# Patient Record
Sex: Female | Born: 1980 | Race: White | Hispanic: No | Marital: Married | Smoking: Former smoker
Health system: Southern US, Community
[De-identification: ages and names within clinical notes are randomized; demographics above are authoritative.]

## PROBLEM LIST (undated history)

## (undated) DIAGNOSIS — F419 Anxiety disorder, unspecified: Secondary | ICD-10-CM

## (undated) DIAGNOSIS — N200 Calculus of kidney: Secondary | ICD-10-CM

## (undated) DIAGNOSIS — Z9889 Other specified postprocedural states: Secondary | ICD-10-CM

## (undated) DIAGNOSIS — F32A Depression, unspecified: Secondary | ICD-10-CM

## (undated) DIAGNOSIS — F329 Major depressive disorder, single episode, unspecified: Secondary | ICD-10-CM

## (undated) DIAGNOSIS — Z8719 Personal history of other diseases of the digestive system: Secondary | ICD-10-CM

## (undated) DIAGNOSIS — Z87442 Personal history of urinary calculi: Secondary | ICD-10-CM

## (undated) DIAGNOSIS — R112 Nausea with vomiting, unspecified: Secondary | ICD-10-CM

## (undated) DIAGNOSIS — K219 Gastro-esophageal reflux disease without esophagitis: Secondary | ICD-10-CM

## (undated) HISTORY — PX: CHOLECYSTECTOMY: SHX55

## (undated) HISTORY — DX: Major depressive disorder, single episode, unspecified: F32.9

## (undated) HISTORY — PX: EYE SURGERY: SHX253

## (undated) HISTORY — DX: Anxiety disorder, unspecified: F41.9

## (undated) HISTORY — DX: Depression, unspecified: F32.A

## (undated) HISTORY — PX: BREAST SURGERY: SHX581

## (undated) HISTORY — DX: Calculus of kidney: N20.0

---

## 2001-02-08 ENCOUNTER — Encounter (HOSPITAL_BASED_OUTPATIENT_CLINIC_OR_DEPARTMENT_OTHER): Payer: Self-pay | Admitting: General Surgery

## 2001-02-10 ENCOUNTER — Encounter (INDEPENDENT_AMBULATORY_CARE_PROVIDER_SITE_OTHER): Payer: Self-pay | Admitting: Specialist

## 2001-02-10 ENCOUNTER — Observation Stay (HOSPITAL_COMMUNITY): Admission: RE | Admit: 2001-02-10 | Discharge: 2001-02-11 | Payer: Self-pay | Admitting: General Surgery

## 2001-02-10 ENCOUNTER — Encounter (HOSPITAL_BASED_OUTPATIENT_CLINIC_OR_DEPARTMENT_OTHER): Payer: Self-pay | Admitting: General Surgery

## 2001-03-19 ENCOUNTER — Other Ambulatory Visit: Admission: RE | Admit: 2001-03-19 | Discharge: 2001-03-19 | Payer: Self-pay | Admitting: Family Medicine

## 2002-03-21 ENCOUNTER — Other Ambulatory Visit: Admission: RE | Admit: 2002-03-21 | Discharge: 2002-03-21 | Payer: Self-pay | Admitting: Family Medicine

## 2002-09-05 ENCOUNTER — Ambulatory Visit (HOSPITAL_COMMUNITY): Admission: RE | Admit: 2002-09-05 | Discharge: 2002-09-05 | Payer: Self-pay | Admitting: Gastroenterology

## 2003-11-17 ENCOUNTER — Other Ambulatory Visit: Admission: RE | Admit: 2003-11-17 | Discharge: 2003-11-17 | Payer: Self-pay | Admitting: *Deleted

## 2004-07-30 ENCOUNTER — Emergency Department (HOSPITAL_COMMUNITY): Admission: EM | Admit: 2004-07-30 | Discharge: 2004-07-30 | Payer: Self-pay | Admitting: Emergency Medicine

## 2004-12-11 ENCOUNTER — Other Ambulatory Visit: Admission: RE | Admit: 2004-12-11 | Discharge: 2004-12-11 | Payer: Self-pay | Admitting: Family Medicine

## 2005-05-09 ENCOUNTER — Ambulatory Visit: Payer: Self-pay | Admitting: Internal Medicine

## 2005-05-20 ENCOUNTER — Ambulatory Visit: Payer: Self-pay | Admitting: Internal Medicine

## 2005-06-06 ENCOUNTER — Ambulatory Visit: Payer: Self-pay | Admitting: Gastroenterology

## 2005-07-01 ENCOUNTER — Ambulatory Visit: Payer: Self-pay | Admitting: Gastroenterology

## 2005-08-27 ENCOUNTER — Ambulatory Visit: Payer: Self-pay | Admitting: Gastroenterology

## 2005-09-24 ENCOUNTER — Ambulatory Visit: Payer: Self-pay | Admitting: Gastroenterology

## 2005-11-26 ENCOUNTER — Ambulatory Visit: Payer: Self-pay | Admitting: Gastroenterology

## 2005-12-17 ENCOUNTER — Ambulatory Visit: Payer: Self-pay | Admitting: Gastroenterology

## 2007-03-11 ENCOUNTER — Emergency Department (HOSPITAL_COMMUNITY): Admission: EM | Admit: 2007-03-11 | Discharge: 2007-03-11 | Payer: Self-pay | Admitting: Emergency Medicine

## 2007-07-22 ENCOUNTER — Inpatient Hospital Stay (HOSPITAL_COMMUNITY): Admission: AD | Admit: 2007-07-22 | Discharge: 2007-07-22 | Payer: Self-pay | Admitting: Obstetrics and Gynecology

## 2007-07-29 ENCOUNTER — Inpatient Hospital Stay (HOSPITAL_COMMUNITY): Admission: AD | Admit: 2007-07-29 | Discharge: 2007-07-29 | Payer: Self-pay | Admitting: Obstetrics and Gynecology

## 2007-08-14 ENCOUNTER — Inpatient Hospital Stay (HOSPITAL_COMMUNITY): Admission: AD | Admit: 2007-08-14 | Discharge: 2007-08-17 | Payer: Self-pay | Admitting: Obstetrics and Gynecology

## 2009-06-13 LAB — TSH: TSH: 3.66 u[IU]/mL (ref <=5.90)

## 2009-12-11 ENCOUNTER — Ambulatory Visit (HOSPITAL_COMMUNITY): Admission: RE | Admit: 2009-12-11 | Discharge: 2009-12-11 | Payer: Self-pay | Admitting: Obstetrics and Gynecology

## 2010-04-24 ENCOUNTER — Encounter (HOSPITAL_COMMUNITY)
Admission: RE | Admit: 2010-04-24 | Discharge: 2010-04-24 | Disposition: A | Discharge status: Home / Self Care | Payer: Managed Care, Other (non HMO) | Source: Ambulatory Visit | Attending: Obstetrics and Gynecology | Admitting: Obstetrics and Gynecology

## 2010-04-24 DIAGNOSIS — Z01812 Encounter for preprocedural laboratory examination: Secondary | ICD-10-CM | POA: Insufficient documentation

## 2010-04-24 LAB — CBC
HCT: 35.4 % — ABNORMAL LOW (ref 36.0–46.0)
Hemoglobin: 11.3 g/dL — ABNORMAL LOW (ref 12.0–15.0)
MCH: 26.5 pg (ref 26.0–34.0)
MCHC: 31.9 g/dL (ref 30.0–36.0)
MCV: 82.9 fL (ref 78.0–100.0)
Platelets: 228 10*3/uL (ref 150–400)
RBC: 4.27 MIL/uL (ref 3.87–5.11)
RDW: 15.8 % — ABNORMAL HIGH (ref 11.5–15.5)
WBC: 11.6 10*3/uL — ABNORMAL HIGH (ref 4.0–10.5)

## 2010-04-24 LAB — RPR: RPR Ser Ql: NONREACTIVE

## 2010-04-24 LAB — SURGICAL PCR SCREEN
MRSA, PCR: NEGATIVE
Staphylococcus aureus: NEGATIVE

## 2010-05-01 ENCOUNTER — Inpatient Hospital Stay (HOSPITAL_COMMUNITY)
Admission: RE | Admit: 2010-05-01 | Discharge: 2010-05-04 | DRG: 766 | Disposition: A | Discharge status: Home / Self Care | Payer: Managed Care, Other (non HMO) | Source: Ambulatory Visit | Attending: Obstetrics and Gynecology | Admitting: Obstetrics and Gynecology

## 2010-05-01 DIAGNOSIS — E669 Obesity, unspecified: Secondary | ICD-10-CM | POA: Diagnosis present

## 2010-05-01 DIAGNOSIS — O34219 Maternal care for unspecified type scar from previous cesarean delivery: Principal | ICD-10-CM | POA: Diagnosis present

## 2010-05-02 LAB — CBC
HCT: 29.6 % — ABNORMAL LOW (ref 36.0–46.0)
Hemoglobin: 9.5 g/dL — ABNORMAL LOW (ref 12.0–15.0)
MCH: 26.5 pg (ref 26.0–34.0)
MCHC: 32.1 g/dL (ref 30.0–36.0)
MCV: 82.7 fL (ref 78.0–100.0)
Platelets: 170 10*3/uL (ref 150–400)
RBC: 3.58 MIL/uL — ABNORMAL LOW (ref 3.87–5.11)
RDW: 15.8 % — ABNORMAL HIGH (ref 11.5–15.5)
WBC: 15 10*3/uL — ABNORMAL HIGH (ref 4.0–10.5)

## 2010-05-04 NOTE — Discharge Summary (Signed)
  NAMEELIANI, LECLERE            ACCOUNT NO.:  [id number]  MEDICAL RECORD NO.:  [id number]           PATIENT TYPE:  I  LOCATION:  9131                          [facility]ACILITY:  [facility]  PHYSICIAN:  Gerrit Friends. Aldona Bar, M.D.   DATE OF BIRTH:  [birth year]  DATE OF ADMISSION:  05/01/2010 DATE OF DISCHARGE:  05/04/2010                              DISCHARGE SUMMARY   DISCHARGE DIAGNOSES: 1. Term pregnancy, delivered 8 pounds 13 ounces female infant, Apgars 9     and 9. 2. Previous cesarean section. 3. Obesity.  PROCEDURE:  Repeat low transverse cesarean section.  SUMMARY:  This 30 year old patient was admitted for repeat cesarean section at term having had a relatively benign pregnancy.  She was taken to the operating room on May 01, 2010, and was delivered of an 8 pounds and 13 ounces female infant with good Apgars.  Her postoperative course was relatively uncomplicated.  She was restarted on her Prozac postpartum.  Discharge hemoglobin was 9.3 with a white count of 15,000 and a platelet count of 170,000.  On the morning of May 04, 2010, she was afebrile, vital signs were stable.  She was tolerating a regular diet well.  She was bottle feeding without difficulty, and she was desirous of discharge.  She will return to the office for followup.     Gerrit Friends. Aldona Bar, M.D.     RMW/MEDQ  D:  05/04/2010  T:  05/04/2010  Job:  [id number]  Electronically Signed by Annamaria Helling M.D. on 05/04/2010 03:39:51 PM

## 2010-05-04 NOTE — Discharge Summary (Signed)
  NAMEESPERANSA, [name]            ACCOUNT NO.:  [id number]  MEDICAL RECORD NO.:  [id number]           PATIENT TYPE:  I  LOCATION:  9131                          [facility]  PHYSICIAN:  Gerrit Friends. Aldona Bar, M.D.   DATE OF BIRTH:  Dec 10, 1980  DATE OF ADMISSION:  05/01/2010 DATE OF DISCHARGE:  05/04/2010                              DISCHARGE SUMMARY   DISCHARGE DIAGNOSES: 1. Term pregnancy, delivered 8 pounds 13 ounces female infant, Apgars 9     and 9. 2. Blood type A+. 3. Previous cesarean section. 4. Obesity.  PROCEDURE:  Repeat low transverse cesarean section.  SUMMARY:  This 30 year old gravida 2 now para 2 was admitted on May 01, 2010, at term for repeat cesarean section.  She was taken to the operating room by Dr. Henderson Cloud and was delivered of an 8 pounds 13 ounces female infant with good Apgars.  Her postoperative course was uncomplicated.  On the morning of May 04, 2010, she was ambulating well, tolerating a regular diet well, having normal bowel and bladder function, was afebrile, was bottle feeding without difficulty and was desirous of discharge.  Her wound was clean and dry.  She will return to the office for staple removal on May 06, 2010, per Dr. Kittie Plater instructions.  She was given a discharge brochure at the time of discharge and understood all instructions well.  Discharge hemoglobin was 9.5 with a white count 15,000 and a platelet count of 170,000.  DISCHARGE MEDICATIONS:  Prescription for Vicodin 7.5 mg to use one every 4-6 hours as needed for severe pain, Motrin 600 mg every 6 hours as needed for cramping or mild pain, and Prozac 20 mg - one up to twice daily as directed.  (She was on Prozac prior to pregnancy for anxiety and feels that that is returning).  She was told her hemoglobin was 9.5 but says that iron makes her very constipated, so she was encouraged to have her diet provide her iron by eating lots of green vegetables,  etc.  CONDITION ON DISCHARGE:  Improved.     Gerrit Friends. Aldona Bar, M.D.     RMW/MEDQ  D:  05/04/2010  T:  05/04/2010  Job:  [id number]  Electronically Signed by Annamaria Helling M.D. on 05/04/2010 03:39:55 PM

## 2010-05-13 NOTE — Op Note (Signed)
[name], [name]            ACCOUNT NO.:  [id number]  MEDICAL RECORD NO.:  [id number]           PATIENT TYPE:  I  LOCATION:  9131                          [facility]ILITY[facility]  PHYSICIAN:  Carrington Clamp, M.D. DATE OF BIRTH:  February 16, 1981  DATE OF PROCEDURE:  05/01/2010 DATE OF DISCHARGE:                              OPERATIVE REPORT   PREOPERATIVE DIAGNOSIS:  Desired repeat cesarean section at term.  POSTOPERATIVE DIAGNOSIS:  Desired repeat cesarean section at term.  PROCEDURE:  Low-transverse cesarean section.  SURGEON:  Carrington Clamp, MD  ASSISTANT:  [name], PA-C  ANESTHESIA:  Spinal.  FINDINGS:  Boy infant, breech presentation, Apgars 9 and 9, and 8 pounds 13 ounces.  Normal tubes, ovaries, and uterus were seen.  SPECIMENS:  None.  ESTIMATED BLOOD LOSS:  500 mL.  IV FLUIDS:  2400 mL.  URINE OUTPUT:  150 mL.  COMPLICATIONS:  None surgical, however, anesthetically the patient had 2 episodes of asystole witnessed by the CRNAs only.  The patient had actually passed out but was soon responsive and all parameters of vital signs were stable after that.  The patient was slightly somnolent after the episode.  There were no other further complications.  The patient tolerated the procedure well.  The nurse and anesthesiologist were on hand at the time and there were no complications from what appeared to be a vagal reaction to peritoneal manipulation.  COUNTS:  Correct x3.  TECHNIQUE:  After adequate spinal anesthesia was achieved, the patient was prepped and draped in the usual sterile fashion, dorsal supine position with leftward tilt.  A Pfannenstiel skin incision was made with a scalpel and carried down to the fascia with Bovie cautery.  The fascia was incised in midline with the scalpel and carried in transverse curvilinear manner with Mayo scissors.  The fascia was reflected superiorly and inferiorly from the rectus muscles.  The rectus muscles were  split in the midline.  A bowel free portion of the peritoneum was then carefully entered into with a scalpel for the superficial layers and then the hemostat for the deeper layers.  The peritoneum was then incised in a superior-inferior manner with the Mayo and Metzenbaum scissors with good visualization of the bowel and the bladder. Peritoneum was then stretched open and the [employer] placed.  The vesicouterine fascia was then tented up and incised in a transverse curvilinear manner.  The bladder flap was retracted inferiorly and a 2- cm incision was made in the upper portion of the lower uterine segment until clear fluid was noted on entry into the amnion.  The uterine incision was then extended in a transverse curvilinear manner bluntly and the baby was identified in a breech presentation and delivered without complication.  The baby was bulb suctioned.  The cord was clamped, cut, and the baby handed to waiting pediatrics.  The placenta was then delivered manually in the uterus and was cleared of all debris. The uterine incision was closed with a running lock stitch of 0- Monocryl.  An imbricating layer of 0-Monocryl was performed as well. Once the hemostasis was achieved in the uterine incision with some Bovie  cautery and the edges of the serosa, the abdomen was irrigated and the ovaries and tubes checked.  The uterine incision remained hemostatic and all instruments were withdrawn from the peritoneal cavity.  The peritoneum was then closed with a running stitch of 2-0 Vicryl.  This incorporated the rectus muscles as a separate layer.  The fascia was then closed with a running stitch of 0-Vicryl.  The subcutaneous tissue was rendered hemostatic and revised with the Bovie cautery.  Once hemostasis was achieved, this was closed with 5 interrupted stitches of 2-0 plain gut.  The skin was closed with staples.  The patient tolerated the procedure well and was returned to recovery  in stable condition.     Carrington Clamp, M.D.     MH/MEDQ  D:  05/01/2010  T:  05/01/2010  Job:  [id number]  Electronically Signed by Carrington Clamp MD on 05/13/2010 08:28:43 AM

## 2010-07-23 NOTE — Op Note (Signed)
[name], [name]            ACCOUNT NO.:  [id number]   MEDICAL RECORD NO.:  [id number]          PATIENT TYPE:  INP   LOCATION:  9124                          [facility]ILITY:  [facility]   PHYSICIAN:  Carrington Clamp, M.D. DATE OF BIRTH:  01/08/1981   DATE OF PROCEDURE:  08/14/2007  DATE OF DISCHARGE:                               OPERATIVE REPORT   PREOPERATIVE DIAGNOSES:  1. Arrest of active phase.  2. Nonreassuring fetal heart tones remote vaginal delivery.   POSTOPERATIVE DIAGNOSIS:  1. Arrest of active phase.  2. Nonreassuring fetal heart tones remote vaginal delivery.   PROCEDURE:  Low-transverse cesarean section.   SURGEON:  Carrington Clamp, MD.   ANESTHESIA:  Epidural.   ESTIMATED BLOOD LOSS:  700 mL.   IV FLUIDS:  1200 mL.   URINE OUTPUT:  100 mL.   MEDICATIONS:  Ancef and Pitocin.   PATHOLOGY:  None.   COMPLICATIONS:  None.   FINDINGS:  Female infant, vertex presentation, transverse OT with Apgars  of 7 and 9.  Weight was 8 pounds and 4 ounces.  Normal tubes, ovaries,  and uterus were otherwise seen.   COUNTS:  Correct x3.   TECHNIQUE:  After adequate epidural anesthesia was achieved, the patient  was prepped and draped in the usual sterile fashion in dorsal supine  position with leftward tilt.  A Pfannenstiel skin incision was made with  scalpel and carried down to the fascia with the Bovie cautery.  The  fascia was incised in the midline with a scalpel and carried in the  transverse curvilinear manner with Mayo scissors.  The fascia was  reflected superior to inferiorly from the rectus muscles.  Rectus muscle  split in rhe midline.  The bowel free portion of peritoneum was entered  into bluntly and peritoneum incised in the superior inferior manner with  good visualization of the bowel and the bladder.   The bladder blade was placed in the vesicouterine fascia and tented up  and incised in transverse curvilinear manner.  The bladder flap was  created with  blunt dissection and bladder blade replaced.  A 2-cm  incision was made in the upper portion of lower uterine segment  transversely.  Meconium was noted on entry into the amnion which had not  been noted earlier.  The incision was extended transversely, bluntly and  the baby was identified in the occiput transverse presentation.  The  baby was delivered without complications through the incision and was  bulb suctioned.  The cord was clamped and cut, and the baby was handed  to awaiting pediatrics.   The placenta was delivered manually and the uterus exteriorized, wrapped  in wet lap, cleared of all debris.  The uterine incision was then closed  with a running lock stitch of 0 Monocryl.  An imbricating layer of 0  Monocryl was performed as well.  The uterus was reapproximated in the  abdomen.  The abdomen cleared of all debris with irrigation.  The  uterine incisions were reinspected and found to be hemostatic.  The  peritoneum was then closed with a running stitch of 2-0 Vicryl and  this  incorporated the rectus muscles as a separate layer.  The fascia was  closed with running stitch of 0 Vicryl.  The subcutaneous tissue was  then hemostatic with Bovie cautery and irrigation.  This was then closed  with interrupted stitches of 2-0 plain gut.  The skin was closed with  staples.  The patient tolerated the procedure well and was returned to  the recovery in stable condition.      Carrington Clamp, M.D.  Electronically Signed     MH/MEDQ  D:  08/14/2007  T:  08/15/2007  Job:  [id number]

## 2010-07-26 NOTE — Op Note (Signed)
[facility]  Patient:    [name], [name] Visit Number: [id number] MRN: [id number]          Service Type: SUR Location: 3W 0376 01 Attending Physician:  Fortino Sic Dictated by:   Marnee Spring. Wiliam Ke, M.D. Proc. Date: 02/10/01 Admit Date:  02/10/2001                             Operative Report  PREOPERATIVE DIAGNOSIS:  Symptomatic cholelithiasis.  POSTOPERATIVE DIAGNOSIS:  Symptomatic cholelithiasis.  OPERATION:  Laparoscopic cholecystectomy with intraoperative cholangiogram.  SURGEON: Marnee Spring. Wiliam Ke, M.D.  ASSISTANT:  Mardene Celeste. Lurene Shadow, M.D.  ANESTHESIA:  General endotracheal by hospital.  DESCRIPTION OF PROCEDURE:  Under good endotracheal anesthesia, the skin of the abdomen was prepped and draped in the usual manner. The incision was made above the umbilicus, the abdomen was entered, and a pursestring suture was placed. Hasson cannula was inserted and pneumoperitoneum was obtained. The peritoneal surfaces appeared normal. A 10 mm with two 5 mm cannulas were placed in the usual fashion. Dissecting the porta hepatis, the cystic duct-gallbladder junction was identified. A clip was placed on the gallbladder side. The cystic duct was opened, Reddick catheter was inserted, and a cholangiogram was performed using a fluoroscopic technique. This was normal. The cystic duct was triply clipped and cut leaving two clips proximally. The cystic artery was triply clipped then cut leaving two clips proximally. The gallbladder was removed from the liver bed with electrocautery current and hemostasis was obtained with electrocautery current. The gallbladder was removed via the umbilical port and an endopouch. The right upper quadrant was copiously irrigated with saline and suctioned dry. Cannula was removed, pursestring suture was tied, the skin wounds were closed with 4-0 Vicryl and Steri-Strips. Estimated blood loss for the procedure was minimal. The  patient received no blood and left the operating room in stable condition after sponge and needle counts were verified. Dictated by:   Marnee Spring. Wiliam Ke, M.D. Attending Physician:  Fortino Sic DD:  02/10/01 TD:  02/10/01 Job: [id number]

## 2010-07-26 NOTE — Op Note (Signed)
   NAMEKAIANNA, DOLEZAL                   ACCOUNT NO.:  [id number]   MEDICAL RECORD NO.:  [id number]                   PATIENT TYPE:  AMB   LOCATION:  ENDO                                 FACILITY:  [facility]   PHYSICIAN:  Anselmo Rod, M.D.               DATE OF BIRTH:  [birth year]   DATE OF PROCEDURE:  09/05/2002  DATE OF DISCHARGE:                                 OPERATIVE REPORT   PROCEDURE PERFORMED:  Colonoscopy.   ENDOSCOPIST:  Anselmo Rod, M.D.   INSTRUMENT USED:  Olympus video colonoscope.   INDICATION FOR PROCEDURE:  Abdominal pain with mucoid stools and diarrhea in  a 30 year old white female with a family history of Crohn's.  Rule out IBD.   PREPROCEDURE PREPARATION:  Informed consent was procured from the patient.  The patient was fasted for eight hours prior to the procedure and prepped  with a bottle of magnesium citrate and two bottles of Fleet's Phospho-Soda  as she could not tolerate the GoLYTELY prep.   PREPROCEDURE PHYSICAL:  VITAL SIGNS:  The patient had stable vital signs.  NECK:  Supple.  CHEST:  Clear to auscultation.  S1, S2 regular.  ABDOMEN:  Soft with normal bowel sounds.   DESCRIPTION OF PROCEDURE:  The patient was placed in the left lateral  decubitus position and sedated with 75 mg of Demerol and 7 mg of Versed  intravenously.  Once the patient was adequately sedate and maintained on low-  flow oxygen and continuous cardiac monitoring, the Olympus video colonoscope  was advanced from the rectum to the cecum and terminal ileum without  difficulty.  The entire colonic mucosa, including the terminal ileum,  appeared normal and without lesions.  No masses, polyps, erosions,  ulcerations, or diverticula were seen.  The appendiceal orifice and the  ileocecal valve were clearly visualized and photographed.  Retroflexion in  the rectum revealed no abnormalities.   IMPRESSION:  Normal colonoscopy to the terminal ileum.  No masses, polyps,  erosions, ulcerations, or diverticula seen.    RECOMMENDATIONS:  1. A high-fiber diet with liberal fluid intake has been recommended for     possible IBS-like symptoms.  2. Outpatient follow-up in the next two weeks for further recommendations.                                               Anselmo Rod, M.D.    JNM/MEDQ  D:  09/05/2002  T:  09/06/2002  Job:  [id number]   cc:   Talmadge Coventry, M.D.  [address]  [place]  [place] [address]  Fax: [phone]

## 2010-07-26 NOTE — Discharge Summary (Signed)
[name], [name]            ACCOUNT NO.:  [id number]   MEDICAL RECORD NO.:  [id number]          PATIENT TYPE:  INP   LOCATION:  9124                          [facility]   PHYSICIAN:  Ilda Mori, M.D.   DATE OF BIRTH:  1980-03-16   DATE OF ADMISSION:  08/14/2007  DATE OF DISCHARGE:  08/17/2007                               DISCHARGE SUMMARY   FINAL DIAGNOSES:  1. Intrauterine pregnancy at 82 weeks' gestation for induction of      labor.  2. Arrest of the active phase of labor and nonreassuring fetal heart      tones.  3. Remote from vaginal delivery.   PROCEDURE:  Primary low transverse cesarean section.   SURGEON:  Carrington Clamp, MD.   COMPLICATIONS:  None.   This 30 year old G1 P0 was admitted on admitted on June 6 to undergo an  induction of labor.  The patient's antepartum course up to this point  has been uncomplicated.  She did have a negative group B strep culture  obtained in our office.  The patient dilated about 5 cm of dilation and  then had no change over her cervix over many hours.  There was also some  severe variables noted with contractions.  At this point secondary to  the patient being remote from delivery and some nonreassuring fetal  heart tones, the decision was made to proceed with a cesarean section.  The patient was taken to the operating room on August 14, 2007, by Dr.  Carrington Clamp where a primary low transverse cesarean section was  performed with the delivery of a 8 pounds 4 ounces female infant with  Apgars of 7 and 9.  The infant presented in the transverse OT  presentation.  Delivery went without complications.  The patient's  postoperative course was benign without any significant fevers.  The  patient was felt ready for discharge on postoperative day #3.  She was  sent home on a regular diet, told to decrease activities, told to  continue her prenatal vitamins, was given Darvocet N 100 #20 one every 4-  6 hours as needed for  her pain, told she could use ibuprofen up to 600  mg every 6 hours as needed for pain, and was to follow up in our office  in 4 weeks.  Instructions and precautions were reviewed with the  patient.   LABORATORIES ON DISCHARGE:  The patient had a hemoglobin of 12.2, white  blood cell count of 22.2, and platelets of 221,000.      [name], P.A.-C.      Ilda Mori, M.D.  Electronically Signed   MB/MEDQ  D:  09/03/2007  T:  09/04/2007  Job:  [id number]

## 2010-08-13 ENCOUNTER — Other Ambulatory Visit (INDEPENDENT_AMBULATORY_CARE_PROVIDER_SITE_OTHER): Payer: Self-pay | Admitting: Surgery

## 2010-08-14 LAB — VITAMIN D 25 HYDROXY (VIT D DEFICIENCY, FRACTURES): Vit D, 25-Hydroxy: 32 ng/mL (ref 30–89)

## 2010-08-19 ENCOUNTER — Other Ambulatory Visit (INDEPENDENT_AMBULATORY_CARE_PROVIDER_SITE_OTHER): Payer: Self-pay | Admitting: Surgery

## 2010-08-20 LAB — CALCIUM, URINE, 24 HOUR
Calcium, 24 hour urine: 54 mg/d — ABNORMAL LOW (ref 100–250)
Calcium, Ur: 6 mg/dL

## 2010-08-27 ENCOUNTER — Ambulatory Visit (HOSPITAL_COMMUNITY): Payer: Managed Care, Other (non HMO)

## 2010-08-27 ENCOUNTER — Encounter (HOSPITAL_COMMUNITY): Payer: Self-pay

## 2010-08-27 ENCOUNTER — Encounter (HOSPITAL_COMMUNITY)
Admission: RE | Admit: 2010-08-27 | Discharge: 2010-08-27 | Disposition: A | Discharge status: Home / Self Care | Payer: Managed Care, Other (non HMO) | Source: Ambulatory Visit | Attending: Surgery | Admitting: Surgery

## 2010-08-27 DIAGNOSIS — E0590 Thyrotoxicosis, unspecified without thyrotoxic crisis or storm: Secondary | ICD-10-CM | POA: Insufficient documentation

## 2010-08-27 MED ORDER — TECHNETIUM TC 99M SESTAMIBI - CARDIOLITE
23.7000 | Freq: Once | INTRAVENOUS | Status: AC | PRN
  Administered 2010-08-27: 10:00:00 23.7 via INTRAVENOUS

## 2010-09-04 ENCOUNTER — Telehealth (INDEPENDENT_AMBULATORY_CARE_PROVIDER_SITE_OTHER): Payer: Self-pay

## 2010-09-04 NOTE — Telephone Encounter (Signed)
Message copied by Glenna Fellows on Wed Sep 04, 2010 10:38 AM ------      Message from: [facility]      Created: Tue Sep 03, 2010 10:13 AM      Contact: [phone]       [name] called regarding information about her test results.

## 2010-10-07 ENCOUNTER — Encounter (INDEPENDENT_AMBULATORY_CARE_PROVIDER_SITE_OTHER): Payer: Managed Care, Other (non HMO) | Admitting: Surgery

## 2010-11-27 LAB — CBC
HCT: 34.3 — ABNORMAL LOW
Hemoglobin: 12
MCHC: 35.1
MCV: 81.2
Platelets: 308
RBC: 4.22
RDW: 13.4
WBC: 16.5 — ABNORMAL HIGH

## 2010-11-27 LAB — COMPREHENSIVE METABOLIC PANEL
ALT: 28
AST: 25
Albumin: 3 — ABNORMAL LOW
Alkaline Phosphatase: 54
BUN: 7
CO2: 21
Calcium: 9.1
Chloride: 106
Creatinine, Ser: 0.56
GFR calc Af Amer: >60
GFR calc non Af Amer: >60
Glucose, Bld: 91
Potassium: 3.8
Sodium: 137
Total Bilirubin: 0.6
Total Protein: 6.3

## 2010-11-27 LAB — LIPASE, BLOOD: Lipase: 22

## 2010-11-27 LAB — DIFFERENTIAL
Basophils Absolute: 0
Basophils Relative: 0
Eosinophils Absolute: 0.2
Eosinophils Relative: 1
Lymphocytes Relative: 12
Lymphs Abs: 2
Monocytes Absolute: 0.8
Monocytes Relative: 5
Neutro Abs: 13.5 — ABNORMAL HIGH
Neutrophils Relative %: 82 — ABNORMAL HIGH

## 2010-11-27 LAB — URINE MICROSCOPIC-ADD ON

## 2010-11-27 LAB — URINALYSIS, ROUTINE W REFLEX MICROSCOPIC
Bilirubin Urine: NEGATIVE
Glucose, UA: NEGATIVE
Ketones, ur: NEGATIVE
Nitrite: NEGATIVE
Protein, ur: 30 — AB
Specific Gravity, Urine: 1.033 — ABNORMAL HIGH
Urobilinogen, UA: 0.2
pH: 5.5

## 2010-12-04 LAB — COMPREHENSIVE METABOLIC PANEL
ALT: 13
AST: 15
Albumin: 2.6 — ABNORMAL LOW
Alkaline Phosphatase: 135 — ABNORMAL HIGH
BUN: 9
CO2: 21
Calcium: 9.1
Chloride: 104
Creatinine, Ser: 0.57
GFR calc Af Amer: >60
GFR calc non Af Amer: >60
Glucose, Bld: 86
Potassium: 4.2
Sodium: 132 — ABNORMAL LOW
Total Bilirubin: 0.6
Total Protein: 6

## 2010-12-04 LAB — URIC ACID: Uric Acid, Serum: 5.1

## 2010-12-04 LAB — CBC
HCT: 34.9 — ABNORMAL LOW
Hemoglobin: 12
MCHC: 34.5
MCV: 83.2
Platelets: 253
RBC: 4.2
RDW: 15.1
WBC: 12.9 — ABNORMAL HIGH

## 2010-12-04 LAB — URINALYSIS, ROUTINE W REFLEX MICROSCOPIC
Bilirubin Urine: NEGATIVE
Glucose, UA: NEGATIVE
Ketones, ur: NEGATIVE
Nitrite: NEGATIVE
Protein, ur: NEGATIVE
Specific Gravity, Urine: 1.025
Urobilinogen, UA: 0.2
pH: 6

## 2010-12-04 LAB — LACTATE DEHYDROGENASE: LDH: 130

## 2010-12-04 LAB — URINE MICROSCOPIC-ADD ON

## 2010-12-04 LAB — URINE CULTURE: Colony Count: 45000

## 2010-12-05 LAB — COMPREHENSIVE METABOLIC PANEL
ALT: 10
AST: 17
Albumin: 2.6 — ABNORMAL LOW
Alkaline Phosphatase: 127 — ABNORMAL HIGH
BUN: 8
CO2: 20
Calcium: 9
Chloride: 105
Creatinine, Ser: 0.47
GFR calc Af Amer: >60
GFR calc non Af Amer: >60
Glucose, Bld: 68 — ABNORMAL LOW
Potassium: 4.3
Sodium: 132 — ABNORMAL LOW
Total Bilirubin: 0.4
Total Protein: 5.5 — ABNORMAL LOW

## 2010-12-05 LAB — CBC
HCT: 33.7 — ABNORMAL LOW
HCT: 34.4 — ABNORMAL LOW
Hemoglobin: 11.9 — ABNORMAL LOW
Hemoglobin: 12.2
MCHC: 35.3
MCHC: 35.5
MCV: 82.5
MCV: 83.5
Platelets: 221
Platelets: 251
RBC: 4.08
RBC: 4.11
RDW: 15
RDW: 15.3
WBC: 13.8 — ABNORMAL HIGH
WBC: 22.2 — ABNORMAL HIGH

## 2010-12-05 LAB — URIC ACID: Uric Acid, Serum: 5.1

## 2010-12-05 LAB — RPR: RPR Ser Ql: NONREACTIVE

## 2010-12-05 LAB — LACTATE DEHYDROGENASE: LDH: 144

## 2011-05-06 ENCOUNTER — Encounter: Payer: Self-pay | Admitting: Family Medicine

## 2011-05-07 ENCOUNTER — Ambulatory Visit (INDEPENDENT_AMBULATORY_CARE_PROVIDER_SITE_OTHER): Payer: Managed Care, Other (non HMO) | Admitting: Internal Medicine

## 2011-05-07 ENCOUNTER — Encounter: Payer: Self-pay | Admitting: Internal Medicine

## 2011-05-07 DIAGNOSIS — Z0000 Encounter for general adult medical examination without abnormal findings: Secondary | ICD-10-CM

## 2011-05-07 DIAGNOSIS — L659 Nonscarring hair loss, unspecified: Secondary | ICD-10-CM

## 2011-05-07 DIAGNOSIS — E559 Vitamin D deficiency, unspecified: Secondary | ICD-10-CM | POA: Insufficient documentation

## 2011-05-07 DIAGNOSIS — N200 Calculus of kidney: Secondary | ICD-10-CM

## 2011-05-07 LAB — POCT URINALYSIS DIPSTICK
Bilirubin, UA: NEGATIVE
Blood, UA: NEGATIVE
Glucose, UA: NEGATIVE
Leukocytes, UA: NEGATIVE
Nitrite, UA: NEGATIVE
Protein, UA: NEGATIVE
Spec Grav, UA: 1.025
Urobilinogen, UA: 0.2
pH, UA: 5.5

## 2011-05-07 MED ORDER — BELLADONNA ALK-PHENOBARBITAL 16.2 MG PO TABS: ORAL_TABLET | ORAL | Status: DC

## 2011-05-07 MED ORDER — CLONAZEPAM 0.5 MG PO TABS: 0.5000 mg | ORAL_TABLET | Freq: Two times a day (BID) | ORAL | Status: DC | PRN

## 2011-05-07 MED ORDER — FLUOXETINE HCL 20 MG PO TABS: 20.0000 mg | ORAL_TABLET | Freq: Every day | ORAL | Status: DC

## 2011-05-07 NOTE — Progress Notes (Signed)
  Subjective:    Patient ID: [name], female    DOB: 1980-12-22, 31 y.o.   MRN: [id number]  HPI Here for annual exam Problems include depression and anxiety, IBS, recurrent kidney stone, and childbirth 12 months ago, history of elevated PTH with a negative evaluation by Dr. Gerrit Friends, and recent hair loss. Depression and anxiety are stable on 20 mg of Prozac and occasional Clonopin. Her current stones were evaluated by  urology with referral to endocrinology at [facility]. 24-hour urine suggested she needed citrate but this has not worked because she has to take 4 pills 3 times a day she can't manage that. She can manage to lose weight despite a fairly restricted diet and active exercise or by [employer]. All her sibs her heavy and her parents as well.    Review of SystemsReview of systems sheet Scanned in     Objective:   Physical ExamVital signs are Stable except BMI greater than 30 HEENT is clear without thyromegaly or lymphadenopathy Heart is regular without murmurs rubs or gallops Lungs are clear Abdomen is soft nontender nondistended with no organomegaly Extremities showed trace edema lower Neuro is intact        Assessment & Plan:  P# 1Annual exam with routine labs/immunizations reported as up today  Problem #2 recurrent kidney stones-needs to be monitored for the possible emergence of hyperparathyroidism/should followup with endocrinology to consider dietary changes because of frequent recurrences  Problem #3 obesity discussed need to increase metabolic rate  Problem 4 history of vitamin D deficiency and history of elevated PTH-time to recheck   Problem #5 depression and anxiety-continue current meds

## 2011-05-08 LAB — COMPREHENSIVE METABOLIC PANEL
ALT: 23 U/L (ref 0–35)
AST: 17 U/L (ref 0–37)
Albumin: 4.4 g/dL (ref 3.5–5.2)
Alkaline Phosphatase: 66 U/L (ref 39–117)
BUN: 11 mg/dL (ref 6–23)
CO2: 25 mEq/L (ref 19–32)
Calcium: 9.1 mg/dL (ref 8.4–10.5)
Chloride: 104 mEq/L (ref 96–112)
Creat: 0.76 mg/dL (ref 0.50–1.10)
Glucose, Bld: 89 mg/dL (ref 70–99)
Potassium: 4.6 mEq/L (ref 3.5–5.3)
Sodium: 141 mEq/L (ref 135–145)
Total Bilirubin: 0.3 mg/dL (ref 0.3–1.2)
Total Protein: 6.9 g/dL (ref 6.0–8.3)

## 2011-05-08 LAB — LIPID PANEL
Cholesterol: 203 mg/dL — ABNORMAL HIGH (ref 0–200)
HDL: 33 mg/dL — ABNORMAL LOW (ref >39)
LDL Cholesterol: 126 mg/dL — ABNORMAL HIGH (ref 0–99)
Total CHOL/HDL Ratio: 6.2 Ratio
Triglycerides: 220 mg/dL — ABNORMAL HIGH (ref <150)
VLDL: 44 mg/dL — ABNORMAL HIGH (ref 0–40)

## 2011-05-08 LAB — CBC WITH DIFFERENTIAL/PLATELET
Basophils Absolute: 0.1 10*3/uL (ref 0.0–0.1)
Basophils Relative: 1 % (ref 0–1)
Eosinophils Absolute: 0.5 10*3/uL (ref 0.0–0.7)
Eosinophils Relative: 6 % — ABNORMAL HIGH (ref 0–5)
HCT: 41.5 % (ref 36.0–46.0)
Hemoglobin: 13.2 g/dL (ref 12.0–15.0)
Lymphocytes Relative: 23 % (ref 12–46)
Lymphs Abs: 2.1 10*3/uL (ref 0.7–4.0)
MCH: 26.2 pg (ref 26.0–34.0)
MCHC: 31.8 g/dL (ref 30.0–36.0)
MCV: 82.3 fL (ref 78.0–100.0)
Monocytes Absolute: 0.5 10*3/uL (ref 0.1–1.0)
Monocytes Relative: 5 % (ref 3–12)
Neutro Abs: 6.2 10*3/uL (ref 1.7–7.7)
Neutrophils Relative %: 66 % (ref 43–77)
Platelets: 351 10*3/uL (ref 150–400)
RBC: 5.04 MIL/uL (ref 3.87–5.11)
RDW: 14 % (ref 11.5–15.5)
WBC: 9.4 10*3/uL (ref 4.0–10.5)

## 2011-05-08 LAB — TSH: TSH: 2.058 u[IU]/mL (ref 0.350–4.500)

## 2011-05-08 LAB — VITAMIN D 25 HYDROXY (VIT D DEFICIENCY, FRACTURES): Vit D, 25-Hydroxy: 26 ng/mL — ABNORMAL LOW (ref 30–89)

## 2011-05-09 ENCOUNTER — Encounter: Payer: Self-pay | Admitting: Internal Medicine

## 2011-06-02 ENCOUNTER — Telehealth: Payer: Self-pay

## 2011-06-02 ENCOUNTER — Encounter: Payer: Self-pay | Admitting: Internal Medicine

## 2011-06-02 NOTE — Telephone Encounter (Signed)
Pt called to check on lab results from last month. Dr Merla Riches, I did not see your comment on pts labs. Could you please review her results and document what you would like [place] to tell the pt?

## 2011-06-12 NOTE — Telephone Encounter (Signed)
LMOM asking pt to CB if she did not receive the letter and lab results that Dr Merla Riches had written. See letter written in Epic 06/02/11

## 2011-09-23 ENCOUNTER — Telehealth: Payer: Self-pay

## 2011-09-23 MED ORDER — FLUOXETINE HCL 40 MG PO CAPS: 40.0000 mg | ORAL_CAPSULE | Freq: Every day | ORAL | Status: DC

## 2011-09-23 NOTE — Telephone Encounter (Signed)
I think that is fine - I would like for the patient to make an appt after 1 month on the medication.  For what she has left she can take 2 pills a day.

## 2011-09-23 NOTE — Telephone Encounter (Signed)
PT STATES SHE IS ON 20MG S PROZAC, BUT WOULD LIKE TO UP THE DOSAGE TO 40MG S INSTEAD PLEASE CALL [phone]

## 2011-09-24 NOTE — Telephone Encounter (Signed)
Spoke with patient and let her know that rx was called into pharmacy and that they would like to come in for a follow up in one month.

## 2011-10-13 MED ORDER — FLUOXETINE HCL 40 MG PO CAPS: 40.0000 mg | ORAL_CAPSULE | Freq: Every day | ORAL | Status: DC

## 2011-10-13 NOTE — Telephone Encounter (Signed)
Pt is calling in again to request a rx refill on prozac and would like to 40 mg dose [phone]

## 2011-10-13 NOTE — Telephone Encounter (Signed)
It all is well, I sent in 90 d supply  2 refills and can see her in feb 2014 as planned

## 2011-10-13 NOTE — Telephone Encounter (Signed)
Pt p/up Rx sent in on 7/16 and is not out yet, but she wanted to ask Dr Merla Riches if he really needed to see her bf another RF. She stated that she had been on the 40 mgs of prozac before and had decreased to 20mg . She stated Dr Merla Riches usually only needs to see her once a year and RFs Rxs for the year, and since she had previously been on the 40 mgs, she wondered if it was necessary for her to RTC at this point. If possible, pt would like Dr Merla Riches to RF the Prozac 40 to get her through until her yearly PE in Feb.

## 2011-10-14 NOTE — Telephone Encounter (Signed)
Notified pt RFs were sent in and OK to f/up in Feb if all conts to go well. RTC sooner if feels med is not working. Pt agreed.

## 2012-01-12 ENCOUNTER — Other Ambulatory Visit: Payer: Self-pay | Admitting: Diagnostic Neuroimaging

## 2012-01-12 DIAGNOSIS — R519 Headache, unspecified: Secondary | ICD-10-CM

## 2012-01-12 DIAGNOSIS — H4710 Unspecified papilledema: Secondary | ICD-10-CM

## 2012-01-15 ENCOUNTER — Other Ambulatory Visit: Payer: Self-pay | Admitting: Internal Medicine

## 2012-01-16 ENCOUNTER — Ambulatory Visit
Admission: RE | Admit: 2012-01-16 | Discharge: 2012-01-16 | Disposition: A | Discharge status: Home / Self Care | Payer: Managed Care, Other (non HMO) | Source: Ambulatory Visit | Attending: Diagnostic Neuroimaging | Admitting: Diagnostic Neuroimaging

## 2012-01-16 VITALS — BP 114/61 | HR 69

## 2012-01-16 DIAGNOSIS — R519 Headache, unspecified: Secondary | ICD-10-CM

## 2012-01-16 DIAGNOSIS — H4710 Unspecified papilledema: Secondary | ICD-10-CM

## 2012-01-16 DIAGNOSIS — R51 Headache: Secondary | ICD-10-CM

## 2012-01-16 LAB — PROTEIN, CSF: Total Protein, CSF: 32 mg/dL (ref 15–45)

## 2012-01-16 LAB — CSF CELL COUNT WITH DIFFERENTIAL
RBC Count, CSF: 8 cu mm — ABNORMAL HIGH
Tube #: 3
WBC, CSF: 2 cu mm (ref 0–5)

## 2012-01-16 LAB — GLUCOSE, CSF: Glucose, CSF: 61 mg/dL (ref 43–76)

## 2012-01-16 NOTE — Progress Notes (Signed)
Discharge instructions explained at length to pt and husband.

## 2012-01-17 ENCOUNTER — Other Ambulatory Visit: Payer: Self-pay | Admitting: *Deleted

## 2012-04-12 ENCOUNTER — Other Ambulatory Visit: Payer: Self-pay | Admitting: Physician Assistant

## 2012-04-18 NOTE — Telephone Encounter (Signed)
Was given 6 month supply that would run out end of feb

## 2012-04-26 ENCOUNTER — Telehealth: Payer: Self-pay

## 2012-04-26 MED ORDER — CLONAZEPAM 0.5 MG PO TABS: 0.5000 mg | ORAL_TABLET | Freq: Two times a day (BID) | ORAL | Status: DC | PRN

## 2012-04-26 NOTE — Telephone Encounter (Signed)
Called pt advised Rx sent.

## 2012-04-26 NOTE — Telephone Encounter (Signed)
Meds ordered this encounter  Medications  . clonazePAM (KLONOPIN) 0.5 MG tablet    Sig: Take 1 tablet (0.5 mg total) by mouth 2 (two) times daily as needed for anxiety.    Dispense:  60 tablet    Refill:  1   F/u 4/14

## 2012-04-26 NOTE — Telephone Encounter (Signed)
Dr. Joneen Boers made appointment for  June 23, 2012 for a physical but she has a pending refill request for Klonapin. Can you refill this? Her number is [phone] She uses [facility] on [address].

## 2012-05-29 ENCOUNTER — Ambulatory Visit: Payer: Managed Care, Other (non HMO) | Admitting: Family Medicine

## 2012-05-29 VITALS — BP 136/85 | HR 89 | Temp 99.4°F | Resp 16 | Ht 65.0 in | Wt 277.0 lb

## 2012-05-29 DIAGNOSIS — J209 Acute bronchitis, unspecified: Secondary | ICD-10-CM

## 2012-05-29 MED ORDER — HYDROCODONE-HOMATROPINE 5-1.5 MG/5ML PO SYRP: 5.0000 mL | ORAL_SOLUTION | Freq: Three times a day (TID) | ORAL | Status: DC | PRN

## 2012-05-29 MED ORDER — AZITHROMYCIN 250 MG PO TABS: ORAL_TABLET | ORAL | Status: DC

## 2012-05-29 NOTE — Progress Notes (Signed)
Patient ID: [name] MRN: [id number], DOB: 09/17/1980, 32 y.o. Date of Encounter: 05/29/2012, 2:03 PM  Primary Physician: No primary provider on file.  Chief Complaint:  Chief Complaint  Patient presents with  . Sore Throat    some PND   . Cough    mostly a dry cough symptoms since Wednesday    HPI: 32 y.o. year old female presents with a 3 day history of nasal congestion, post nasal drip, sore throat, and cough. Mild sinus pressure. Afebrile. No chills. Nasal congestion thick and green/yellow. Cough is productive of green/yellow sputum and not associated with time of day. Ears feel full, leading to sensation of muffled hearing. Has tried OTC cold preps without success. No GI complaints.  No sick contacts, recent antibiotics, or recent travels.   No leg trauma, sedentary periods, h/o cancer, or tobacco use.  Past Medical History  Diagnosis Date  . Hyperparathyroidism   . Depression   . Anxiety      Home Meds: Prior to Admission medications   Medication Sig Start Date End Date Taking? Authorizing Provider  clonazePAM (KLONOPIN) 0.5 MG tablet Take 0.5 mg by mouth 2 (two) times daily as needed.   Yes Historical Provider, MD  clonazePAM (KLONOPIN) 0.5 MG tablet Take 1 tablet (0.5 mg total) by mouth 2 (two) times daily as needed for anxiety. 04/26/12  Yes Tonye Pearson, MD  FLUoxetine (PROZAC) 20 MG capsule Take 20 mg by mouth daily.   Yes Historical Provider, MD  FLUoxetine (PROZAC) 40 MG capsule Take 1 capsule (40 mg total) by mouth daily. 10/13/11 10/12/12 Yes Tonye Pearson, MD  atropine-PHENObarbital-scopolamine-hyoscyamine [facility]) 16.2 MG tablet One before meals if needed 05/07/11   Tonye Pearson, MD  azithromycin (ZITHROMAX Z-PAK) 250 MG tablet Take as directed on pack 05/29/12   Elvina Sidle, MD  clonazePAM (KLONOPIN) 0.5 MG tablet Take 1 tablet (0.5 mg total) by mouth 2 (two) times daily as needed for anxiety. 05/07/11 06/06/11  Tonye Pearson, MD    FLUoxetine (PROZAC) 20 MG tablet Take 1 tablet (20 mg total) by mouth daily. 05/07/11 08/05/11  Tonye Pearson, MD  HYDROcodone-homatropine [facility]) 5-1.5 MG/5ML syrup Take 5 mLs by mouth every 8 (eight) hours as needed for cough. 05/29/12   Elvina Sidle, MD    Allergies: No Known Allergies  History   Social History  . Marital Status: Married    Spouse Name: N/A    Number of Children: N/A  . Years of Education: N/A   Occupational History  . Not on file.   Social History Main Topics  . Smoking status: Former Games developer  . Smokeless tobacco: Not on file  . Alcohol Use: No  . Drug Use: No  . Sexually Active: Not on file   Other Topics Concern  . Not on file   Social History Narrative  . No narrative on file     Review of Systems: Constitutional: negative for chills, fever, night sweats or weight changes Cardiovascular: negative for chest pain or palpitations Respiratory: negative for hemoptysis, wheezing, or shortness of breath Abdominal: negative for abdominal pain, nausea, vomiting or diarrhea Dermatological: negative for rash Neurologic: negative for headache   Physical Exam: Blood pressure 136/85, pulse 89, temperature 99.4 F (37.4 C), temperature source Oral, resp. rate 16, height 5\' 5"  (1.651 m), weight 277 lb (125.646 kg), SpO2 95.00%., Body mass index is 46.1 kg/(m^2). General: Well developed, well nourished, in no acute distress. Head: Normocephalic, atraumatic, eyes without  discharge, sclera non-icteric, nares are congested. Bilateral auditory canals clear, TM's are without perforation, pearly grey with reflective cone of light bilaterally. No sinus TTP. Oral cavity moist, dentition normal. Posterior pharynx with post nasal drip and mild erythema. No peritonsillar abscess or tonsillar exudate. Neck: Supple. No thyromegaly. Full ROM. No lymphadenopathy. Lungs: Coarse breath sounds bilaterally without wheezes, rales, or rhonchi. Breathing is unlabored.  Heart:  RRR with S1 S2. No murmurs, rubs, or gallops appreciated. Msk:  Strength and tone normal for age. Extremities: No clubbing or cyanosis. No edema. Neuro: Alert and oriented X 3. Moves all extremities spontaneously. CNII-XII grossly in tact. Psych:  Responds to questions appropriately with a normal affect.   Labs:   ASSESSMENT AND PLAN:  32 y.o. year old female with bronchitis. Acute bronchitis - Plan: azithromycin (ZITHROMAX Z-PAK) 250 MG tablet, HYDROcodone-homatropine (HYCODAN) 5-1.5 MG/5ML syrup   - -Mucinex -Tylenol/Motrin prn -Rest/fluids -RTC precautions -RTC 3-5 days if no improvement  Signed, Elvina Sidle, MD 05/29/2012 2:03 PM

## 2012-06-23 ENCOUNTER — Ambulatory Visit (INDEPENDENT_AMBULATORY_CARE_PROVIDER_SITE_OTHER): Payer: Managed Care, Other (non HMO) | Admitting: Internal Medicine

## 2012-06-23 ENCOUNTER — Encounter: Payer: Self-pay | Admitting: Internal Medicine

## 2012-06-23 ENCOUNTER — Telehealth: Payer: Self-pay | Admitting: Family Medicine

## 2012-06-23 VITALS — BP 133/80 | HR 80 | Temp 98.1°F | Resp 16 | Ht 65.5 in | Wt 278.0 lb

## 2012-06-23 DIAGNOSIS — Z87442 Personal history of urinary calculi: Secondary | ICD-10-CM

## 2012-06-23 DIAGNOSIS — E785 Hyperlipidemia, unspecified: Secondary | ICD-10-CM

## 2012-06-23 DIAGNOSIS — Z0000 Encounter for general adult medical examination without abnormal findings: Secondary | ICD-10-CM

## 2012-06-23 DIAGNOSIS — F329 Major depressive disorder, single episode, unspecified: Secondary | ICD-10-CM

## 2012-06-23 DIAGNOSIS — F419 Anxiety disorder, unspecified: Secondary | ICD-10-CM

## 2012-06-23 DIAGNOSIS — Z6841 Body Mass Index (BMI) 40.0 and over, adult: Secondary | ICD-10-CM

## 2012-06-23 LAB — CBC WITH DIFFERENTIAL/PLATELET
Basophils Absolute: 0.1 10*3/uL (ref 0.0–0.1)
Basophils Relative: 1 % (ref 0–1)
Eosinophils Absolute: 0.5 10*3/uL (ref 0.0–0.7)
Eosinophils Relative: 5 % (ref 0–5)
HCT: 40.2 % (ref 36.0–46.0)
Hemoglobin: 13.4 g/dL (ref 12.0–15.0)
Lymphocytes Relative: 23 % (ref 12–46)
Lymphs Abs: 2.3 10*3/uL (ref 0.7–4.0)
MCH: 27.3 pg (ref 26.0–34.0)
MCHC: 33.3 g/dL (ref 30.0–36.0)
MCV: 81.9 fL (ref 78.0–100.0)
Monocytes Absolute: 0.6 10*3/uL (ref 0.1–1.0)
Monocytes Relative: 6 % (ref 3–12)
Neutro Abs: 6.5 10*3/uL (ref 1.7–7.7)
Neutrophils Relative %: 65 % (ref 43–77)
Platelets: 361 10*3/uL (ref 150–400)
RBC: 4.91 MIL/uL (ref 3.87–5.11)
RDW: 13.7 % (ref 11.5–15.5)
WBC: 10 10*3/uL (ref 4.0–10.5)

## 2012-06-23 LAB — LIPID PANEL
Cholesterol: 224 mg/dL — ABNORMAL HIGH (ref 0–200)
HDL: 37 mg/dL — ABNORMAL LOW (ref >39)
LDL Cholesterol: 156 mg/dL — ABNORMAL HIGH (ref 0–99)
Total CHOL/HDL Ratio: 6.1 Ratio
Triglycerides: 157 mg/dL — ABNORMAL HIGH (ref <150)
VLDL: 31 mg/dL (ref 0–40)

## 2012-06-23 LAB — COMPREHENSIVE METABOLIC PANEL
ALT: 25 U/L (ref 0–35)
AST: 16 U/L (ref 0–37)
Albumin: 4 g/dL (ref 3.5–5.2)
Alkaline Phosphatase: 57 U/L (ref 39–117)
BUN: 17 mg/dL (ref 6–23)
CO2: 24 mEq/L (ref 19–32)
Calcium: 9 mg/dL (ref 8.4–10.5)
Chloride: 106 mEq/L (ref 96–112)
Creat: 0.71 mg/dL (ref 0.50–1.10)
Glucose, Bld: 94 mg/dL (ref 70–99)
Potassium: 4.6 mEq/L (ref 3.5–5.3)
Sodium: 138 mEq/L (ref 135–145)
Total Bilirubin: 0.4 mg/dL (ref 0.3–1.2)
Total Protein: 7 g/dL (ref 6.0–8.3)

## 2012-06-23 LAB — TSH: TSH: 2.119 u[IU]/mL (ref 0.350–4.500)

## 2012-06-23 MED ORDER — CLONAZEPAM 0.5 MG PO TABS: 0.5000 mg | ORAL_TABLET | Freq: Two times a day (BID) | ORAL | Status: DC | PRN

## 2012-06-23 MED ORDER — FLUOXETINE HCL 10 MG PO TABS: ORAL_TABLET | ORAL | Status: DC

## 2012-06-23 MED ORDER — BUPROPION HCL ER (XL) 150 MG PO TB24: 150.0000 mg | ORAL_TABLET | Freq: Every day | ORAL | Status: DC

## 2012-06-23 NOTE — Telephone Encounter (Signed)
Faxed Rx to pharmacy  

## 2012-06-23 NOTE — Progress Notes (Signed)
Subjective:    Patient ID: [name], female    DOB: [birth year], 32 y.o.   MRN: [id number]  HPI CPE Still strg w/ obesity depr stable tho needs anx meds HS///and feels like prozac is causing fatigue No further stones  PMH= Past Medical History  Diagnosis Date  . Hyperparathyroidism   . Depression   . Anxiety   . Kidney stones    Prior to Admission medications   Medication Sig Start Date End Date Taking? Authorizing Provider         atropine-PHENObarbital-scopolamine-hyoscyamine [facility]) 16.2 MG tablet One before meals if needed//prn 05/07/11   Tonye Pearson, MD         clonazePAM (KLONOPIN) 0.5 MG tablet Take 1 tablet (0.5 mg total) by mouth 2 (two) times daily as needed for anxiety. 05/07/11 06/06/11  Tonye Pearson, MD  prozac 40mg  daily       Social history stable/married with 2 young kids Unable to adhere to an exercise regimen or control her eating   Review of Systems  Psychiatric/Behavioral: Positive for sleep disturbance and agitation. The patient is nervous/anxious.    rest of review of systems negative     Objective:   Physical Exam BP 133/80  Pulse 80  Temp(Src) 98.1 F (36.7 C)  Resp 16  Ht 5' 5.5" (1.664 m)  Wt 278 lb (126.1 kg)  BMI 45.54 kg/m2 HEENT clear Heart regular without murmur Lungs clear Abdomen obese but without masses or organomegaly Extremities clear Neurological intact Mood stable/affect appropriate/thought content is appropriate       Assessment & Plan:   Annual physical exam - Plan: CBC with Differential, Lipid panel, Comprehensive metabolic panel, TSH  Morbid obesity  BMI 45.0-49.9, adult--discussed several approaches to diet and exercise/to consider cognitive behavioral therapy-names given/to consider attending bariatrics initial meeting group  Anxiety and depression--we'll change from Prozac to Wellbutrin  History of nephrolithiasis with elevated parathyroid hormone and vitamin D deficiency  Results for  orders placed in visit on 06/23/12  CBC WITH DIFFERENTIAL      Result Value Range   WBC 10.0  4.0 - 10.5 K/uL   RBC 4.91  3.87 - 5.11 MIL/uL   Hemoglobin 13.4  12.0 - 15.0 g/dL   HCT 11.9  14.7 - 82.9 %   MCV 81.9  78.0 - 100.0 fL   MCH 27.3  26.0 - 34.0 pg   MCHC 33.3  30.0 - 36.0 g/dL   RDW 56.2  13.0 - 86.5 %   Platelets 361  150 - 400 K/uL   Neutrophils Relative 65  43 - 77 %   Neutro Abs 6.5  1.7 - 7.7 K/uL   Lymphocytes Relative 23  12 - 46 %   Lymphs Abs 2.3  0.7 - 4.0 K/uL   Monocytes Relative 6  3 - 12 %   Monocytes Absolute 0.6  0.1 - 1.0 K/uL   Eosinophils Relative 5  0 - 5 %   Eosinophils Absolute 0.5  0.0 - 0.7 K/uL   Basophils Relative 1  0 - 1 %   Basophils Absolute 0.1  0.0 - 0.1 K/uL   Smear Review Criteria for review not met    LIPID PANEL      Result Value Range   Cholesterol 224 (*) 0 - 200 mg/dL   Triglycerides 784 (*) <150 mg/dL   HDL 37 (*) >69 mg/dL   Total CHOL/HDL Ratio 6.1     VLDL 31  0 - 40 mg/dL  LDL Cholesterol 156 (*) 0 - 99 mg/dL  COMPREHENSIVE METABOLIC PANEL      Result Value Range   Sodium 138  135 - 145 mEq/L   Potassium 4.6  3.5 - 5.3 mEq/L   Chloride 106  96 - 112 mEq/L   CO2 24  19 - 32 mEq/L   Glucose, Bld 94  70 - 99 mg/dL   BUN 17  6 - 23 mg/dL   Creat 2.84  1.32 - 4.40 mg/dL   Total Bilirubin 0.4  0.3 - 1.2 mg/dL   Alkaline Phosphatase 57  39 - 117 U/L   AST 16  0 - 37 U/L   ALT 25  0 - 35 U/L   Total Protein 7.0  6.0 - 8.3 g/dL   Albumin 4.0  3.5 - 5.2 g/dL   Calcium 9.0  8.4 - 10.2 mg/dL  TSH      Result Value Range   TSH 2.119  0.350 - 4.500 uIU/mL

## 2012-06-24 DIAGNOSIS — Z87442 Personal history of urinary calculi: Secondary | ICD-10-CM | POA: Insufficient documentation

## 2012-06-24 DIAGNOSIS — F419 Anxiety disorder, unspecified: Secondary | ICD-10-CM | POA: Insufficient documentation

## 2012-06-24 DIAGNOSIS — E785 Hyperlipidemia, unspecified: Secondary | ICD-10-CM | POA: Insufficient documentation

## 2012-06-24 DIAGNOSIS — Z6841 Body Mass Index (BMI) 40.0 and over, adult: Secondary | ICD-10-CM | POA: Insufficient documentation

## 2012-06-28 ENCOUNTER — Encounter: Payer: Self-pay | Admitting: Internal Medicine

## 2012-08-01 ENCOUNTER — Other Ambulatory Visit: Payer: Self-pay

## 2012-08-01 MED ORDER — TOPIRAMATE 25 MG PO TABS: 25.0000 mg | ORAL_TABLET | Freq: Two times a day (BID) | ORAL | Status: DC

## 2012-09-07 ENCOUNTER — Telehealth: Payer: Self-pay

## 2012-09-07 NOTE — Telephone Encounter (Signed)
PT STATES THE WELLBUTRIN DR DOOLITTLE PUT HER ON ISN'T WORKING. WOULD LIKE TO SPEAK WITH SOMEONE PLEASE CALL [phone]

## 2012-09-07 NOTE — Telephone Encounter (Signed)
What dose is she on? Left message for her to call me back

## 2012-09-10 NOTE — Telephone Encounter (Signed)
Called pt- she had not increased the dose to 300 mg as directed. She is going to try this and call us if she has any further concerns.

## 2012-09-13 ENCOUNTER — Telehealth: Payer: Self-pay | Admitting: Family Medicine

## 2012-09-13 NOTE — Telephone Encounter (Signed)
Patient called today and states that she had increased her Wellbutrin dose 300mg  as directed. She does not see any changes in mood. She is questioning if she should increase the Wellbutrin or change the medication. In the past she was on Prozac and she wonders if she should change back or increase Wellbutrin.

## 2012-09-16 ENCOUNTER — Telehealth: Payer: Self-pay

## 2012-09-16 DIAGNOSIS — R4586 Emotional lability: Secondary | ICD-10-CM

## 2012-09-16 NOTE — Telephone Encounter (Signed)
Pt states that she requested an increase on Wellbutrin or a change back to Prozac, she states that with the dosage of Wellbutrin that she is on now she is experiencing mood swings. Pt would also like to know if another provider other than Dr.Doolittle could get this rx refilled since he is out of the office because she is currently out of that medication. [phone]

## 2012-09-16 NOTE — Telephone Encounter (Signed)
Good question Lets refer to psychiatry for answer. Dr Evelene Croon or dr Jennelle Human

## 2012-09-16 NOTE — Telephone Encounter (Signed)
Will advise patient about psychiatry, she is out of meds, can we refill until she can get in?

## 2012-09-17 MED ORDER — BUPROPION HCL ER (XL) 300 MG PO TB24: 300.0000 mg | ORAL_TABLET | Freq: Every day | ORAL | Status: DC

## 2012-09-17 NOTE — Telephone Encounter (Signed)
I can refill at the same dosage until we can get her in.  Please make sure the referral has been done.

## 2012-09-17 NOTE — Telephone Encounter (Signed)
Patient not advised yet, left message for her to call me back

## 2012-09-17 NOTE — Telephone Encounter (Signed)
Pt advised is agreeable to referral.

## 2012-09-17 NOTE — Telephone Encounter (Signed)
Thanks, put in the referral/ pt advised

## 2012-10-06 ENCOUNTER — Telehealth: Payer: Self-pay

## 2012-10-06 ENCOUNTER — Ambulatory Visit (INDEPENDENT_AMBULATORY_CARE_PROVIDER_SITE_OTHER): Payer: Managed Care, Other (non HMO) | Admitting: Family Medicine

## 2012-10-06 VITALS — BP 110/80 | HR 82 | Temp 97.9°F | Resp 16 | Ht 65.5 in | Wt 280.0 lb

## 2012-10-06 DIAGNOSIS — J029 Acute pharyngitis, unspecified: Secondary | ICD-10-CM

## 2012-10-06 LAB — POCT RAPID STREP A (OFFICE): Rapid Strep A Screen: NEGATIVE

## 2012-10-06 MED ORDER — CEFDINIR 300 MG PO CAPS: 300.0000 mg | ORAL_CAPSULE | Freq: Two times a day (BID) | ORAL | Status: DC

## 2012-10-06 NOTE — Patient Instructions (Addendum)
Let me know if you are not better in the next few days- Sooner if worse.   I will be in touch with your throat culture results.

## 2012-10-06 NOTE — Telephone Encounter (Signed)
Advised her, and she indicates her fever has been 102 advised her to come in for this.

## 2012-10-06 NOTE — Telephone Encounter (Signed)
She can try Mucinex and Zyrtec and if not helpful she should come in.

## 2012-10-06 NOTE — Progress Notes (Addendum)
[facility] [address] [place] [address] [phone]  Date:  10/06/2012   Name:  [name]   DOB:  13-Dec-1980   MRN:  [id number]  PCP:  Tonye Pearson, MD    Chief Complaint: Sore Throat   History of Present Illness:  [name] is a 32 y.o. very pleasant female patient who presents with the following:  She has noted that her throat is painful and swollen.  She has PND, a dry cough.  No runny nose or sneezing.   She did have fever and chills 2 days ago- temp to 102.   Her son was recently ill- he is 70 years old and seemed to have a URI No GI symptoms.  She is taking dayquil and nyquil, and ibuprofen and sudafed.   She last took medication at about 0600 today.    She has pseudotumor cerebri and is treated for her HA with topamax   Patient Active Problem List   Diagnosis Date Noted  . BMI 45.0-49.9, adult 06/24/2012  . Anxiety and depression 06/24/2012  . History of nephrolithiasis 06/24/2012  . Other and unspecified hyperlipidemia 06/24/2012  . Vitamin d deficiency 05/07/2011    Past Medical History  Diagnosis Date  . Hyperparathyroidism   . Depression   . Anxiety   . Kidney stones     Past Surgical History  Procedure Laterality Date  . Cholecystectomy    . Cesarean section  6/09  . Breast surgery    . Eye surgery      History  Substance Use Topics  . Smoking status: Former Games developer  . Smokeless tobacco: Not on file  . Alcohol Use: No    Family History  Problem Relation Age of Onset  . Diabetes Father   . Kidney disease Father   . Diabetes Paternal Grandmother   . Cervical cancer Sister     No Known Allergies  Medication list has been reviewed and updated.  Current Outpatient Prescriptions on File Prior to Visit  Medication Sig Dispense Refill  . clonazePAM (KLONOPIN) 0.5 MG tablet Take 1 tablet (0.5 mg total) by mouth 2 (two) times daily as needed for anxiety.  60 tablet  5  .  atropine-PHENObarbital-scopolamine-hyoscyamine (DONNATAL) 16.2 MG tablet One before meals if needed  30 tablet  1  . buPROPion (WELLBUTRIN XL) 300 MG 24 hr tablet Take 1 tablet (300 mg total) by mouth daily.  30 tablet  0  . clonazePAM (KLONOPIN) 0.5 MG tablet Take 1 tablet (0.5 mg total) by mouth 2 (two) times daily as needed for anxiety.  180 tablet  1  . FLUoxetine (PROZAC) 10 MG tablet 2 daily for 2 weeks then 1 daily for 2 weeks then 1 qod til gone  45 tablet  0  . topiramate (TOPAMAX) 25 MG tablet Take 1 tablet (25 mg total) by mouth 2 (two) times daily.  180 tablet  1   No current facility-administered medications on file prior to visit.    Review of Systems:  As per HPI- otherwise negative.   Physical Examination: Filed Vitals:   10/06/12 1526  BP: 110/80  Pulse: 82  Temp: 97.9 F (36.6 C)  Resp: 16   Filed Vitals:   10/06/12 1526  Height: 5' 5.5" (1.664 m)  Weight: 280 lb (127.007 kg)   Body mass index is 45.87 kg/(m^2). Ideal Body Weight: Weight in (lb) to have BMI = 25: 152.2  GEN: WDWN, NAD, Non-toxic, A &  O x 3 HEENT: Atraumatic, Normocephalic. Neck supple. No masses, No LAD.  Bilateral TM wnl, oropharynx normal.  PEERL,EOMI.   Enlarged tonsils bilaterally Ears and Nose: No external deformity. CV: RRR, No M/G/R. No JVD. No thrill. No extra heart sounds. PULM: CTA B, no wheezes, crackles, rhonchi. No retractions. No resp. distress. No accessory muscle use. ABD: S, NT, ND, +BS. No rebound. No HSM. EXTR: No c/c/e NEURO Normal gait.  PSYCH: Normally interactive. Conversant. Not depressed or anxious appearing.  Calm demeanor.   Results for orders placed in visit on 10/06/12  POCT RAPID STREP A (OFFICE)      Result Value Range   Rapid Strep A Screen Negative  Negative    Assessment and Plan: Acute pharyngitis - Plan: POCT rapid strep A, Culture, Group A Strep, cefdinir (OMNICEF) 300 MG capsule  Pharyngitis.  Treat with omnicef while throat culture pending.   She will let me know if not better in the next couple of days, Sooner if worse.   Signed Abbe Amsterdam, MD  10/08/12- called to go over throat culture result.  Negative.  She is feeling much better.  She will finish the omnicef, but let me know if not continuing to get better.

## 2012-10-06 NOTE — Telephone Encounter (Signed)
Pt is calling to see if she needs to come in and be seen because she is having cold symptoms Call back number is [phone]

## 2012-10-08 LAB — CULTURE, GROUP A STREP: Organism ID, Bacteria: NORMAL

## 2013-01-05 ENCOUNTER — Telehealth: Payer: Self-pay

## 2013-01-05 MED ORDER — CLONAZEPAM 0.5 MG PO TABS: 0.5000 mg | ORAL_TABLET | Freq: Two times a day (BID) | ORAL | Status: DC | PRN

## 2013-01-05 NOTE — Telephone Encounter (Signed)
Pharm requests RF of clonazepam 0.5 mg.

## 2013-01-05 NOTE — Telephone Encounter (Signed)
Meds ordered this encounter  Medications  . clonazePAM (KLONOPIN) 0.5 MG tablet    Sig: Take 1 tablet (0.5 mg total) by mouth 2 (two) times daily as needed for anxiety.    Dispense:  60 tablet    Refill:  5

## 2013-01-06 NOTE — Telephone Encounter (Signed)
Called in Rx

## 2013-06-15 ENCOUNTER — Ambulatory Visit (INDEPENDENT_AMBULATORY_CARE_PROVIDER_SITE_OTHER): Payer: Managed Care, Other (non HMO) | Admitting: Internal Medicine

## 2013-06-15 ENCOUNTER — Encounter: Payer: Self-pay | Admitting: Internal Medicine

## 2013-06-15 VITALS — BP 122/74 | HR 84 | Temp 98.7°F | Resp 18 | Ht 65.5 in | Wt 280.0 lb

## 2013-06-15 DIAGNOSIS — Z634 Disappearance and death of family member: Secondary | ICD-10-CM

## 2013-06-15 DIAGNOSIS — F341 Dysthymic disorder: Secondary | ICD-10-CM

## 2013-06-15 DIAGNOSIS — L659 Nonscarring hair loss, unspecified: Secondary | ICD-10-CM

## 2013-06-15 DIAGNOSIS — F329 Major depressive disorder, single episode, unspecified: Secondary | ICD-10-CM

## 2013-06-15 DIAGNOSIS — R6882 Decreased libido: Secondary | ICD-10-CM

## 2013-06-15 DIAGNOSIS — Z0000 Encounter for general adult medical examination without abnormal findings: Secondary | ICD-10-CM

## 2013-06-15 DIAGNOSIS — E785 Hyperlipidemia, unspecified: Secondary | ICD-10-CM

## 2013-06-15 DIAGNOSIS — F419 Anxiety disorder, unspecified: Secondary | ICD-10-CM

## 2013-06-15 DIAGNOSIS — Z6841 Body Mass Index (BMI) 40.0 and over, adult: Secondary | ICD-10-CM

## 2013-06-15 LAB — CBC
HCT: 38.9 % (ref 36.0–46.0)
Hemoglobin: 12.8 g/dL (ref 12.0–15.0)
MCH: 27.4 pg (ref 26.0–34.0)
MCHC: 32.9 g/dL (ref 30.0–36.0)
MCV: 83.3 fL (ref 78.0–100.0)
Platelets: 300 10*3/uL (ref 150–400)
RBC: 4.67 MIL/uL (ref 3.87–5.11)
RDW: 13.5 % (ref 11.5–15.5)
WBC: 9.5 10*3/uL (ref 4.0–10.5)

## 2013-06-15 MED ORDER — CLONAZEPAM 0.5 MG PO TABS: 0.5000 mg | ORAL_TABLET | Freq: Two times a day (BID) | ORAL | Status: DC | PRN

## 2013-06-15 MED ORDER — FLUOXETINE HCL 20 MG PO TABS: 20.0000 mg | ORAL_TABLET | Freq: Every day | ORAL | Status: DC

## 2013-06-15 NOTE — Progress Notes (Signed)
Subjective:    Patient ID: [name], female    DOB: 1980/05/06, 33 y.o.   MRN: [id number]  HPI This chart was scribed for [facility], by Ladona Ridgel Day, Scribe. This patient was seen in room 24 and the patient's care was started at 3:46 PM.  HPI Comments: [name] is a 33 y.o. female who presents to the [facility] for [facility] exam.  She reports that she has been doing well and with no recent illnesses. She has been taking Klonopin twice per day and states that it is doing well for her. She states that it has been very helpful since her father passed away x3 weeks ago. He was in renal failure from his diabetes. Mom in [place] may move here  She is no longer taking Prozac and states that she is doing well without it. She is also not taking Wellbutrin and Topomax and doing well without it.   She also reports having decreased libido and unsure what might be causing it. She does report that she often feels fatigued and that her husband is a truck driver whom also is very tired when he is at home. She states that her and her husband having started exercising regularly and riding bicycles.   She also reports having some hair loss though the midline of her scalp. She saw a dermatologist for this problem and was referred to another specialist. She was told that it might be genetic.   She also reports a mass on her right hip which she has recently noticed. She has not yet seen anyone for this problem. She states the mass is non-tender and not draning  Kids 3/5 Past Medical History  Diagnosis Date  . Hyperparathyroidism   . Depression   . Anxiety   . Kidney stones     No Known Allergies immun UTD   Review of Systems  Constitutional: Negative for fever and chills.  Respiratory: Negative for cough and shortness of breath.   Cardiovascular: Negative for chest pain.  Gastrointestinal: Negative for abdominal pain.  Musculoskeletal: Negative for  back pain.  All other systems reviewed and are negative.     Objective:   Physical Exam  Nursing note and vitals reviewed. Constitutional: She is oriented to person, place, and time. She appears well-developed and well-nourished. No distress.  Wt Readings from Last 3 Encounters: 06/15/13 : 280 lb (127.007 kg) 10/06/12 : 280 lb (127.007 kg) 06/23/12 : 278 lb (126.1 kg)  HENT:  Head: Normocephalic and atraumatic.  Eyes: Conjunctivae are normal. Right eye exhibits no discharge. Left eye exhibits no discharge.  Neck: Normal range of motion.  Cardiovascular: Normal rate, regular rhythm and normal heart sounds.   No murmur heard. Pulmonary/Chest: Effort normal and breath sounds normal. No respiratory distress. She has no wheezes. She has no rales.  Musculoskeletal: Normal range of motion. She exhibits no edema.  No pedal edema  Neurological: She is alert and oriented to person, place, and time.  Skin: Skin is warm and dry.  Psychiatric: She has a normal mood and affect. Thought content normal.   Triage Vitals: BP 122/74  Pulse 84  Temp(Src) 98.7 F (37.1 C)  Resp 18  Ht 5' 5.5" (1.664 m)  Wt 280 lb (127.007 kg)  BMI 45.87 kg/m2  SpO2 95%     Assessment & Plan:  DIAGNOSTIC STUDIES: Oxygen Saturation is 95% on room air, adequate by my interpretation.    PE (physical exam), annual  Anxiety and depression---rest proz 20 --adv to 40 3-4 wks if not better  BMI 45.0-49.9, adult---needs to lose  Other and unspecified hyperlipidemia--start meds if LDL>160  Hair loss--?stress plus heredity  Decreased libido--overloaded  Loss or death of parent--added to #2   COORDINATION OF CARE: At 340 PM Discussed treatment plan with patient which includes blood work, prozac and medication refill. Patient agrees.  Meds ordered this encounter  Medications  . FLUoxetine (PROZAC) 20 MG tablet    Sig: Take 1 tablet (20 mg total) by mouth daily.    Dispense:  90 tablet    Refill:  3  .  clonazePAM (KLONOPIN) 0.5 MG tablet    Sig: Take 1 tablet (0.5 mg total) by mouth 2 (two) times daily as needed for anxiety.    Dispense:  180 tablet    Refill:  1     I personally performed the services described in this documentation, which was scribed in my presence. The recorded information has been reviewed and is accurate.

## 2013-06-16 LAB — COMPLETE METABOLIC PANEL WITH GFR
ALT: 18 U/L (ref 0–35)
AST: 14 U/L (ref 0–37)
Albumin: 4.1 g/dL (ref 3.5–5.2)
Alkaline Phosphatase: 50 U/L (ref 39–117)
BUN: 12 mg/dL (ref 6–23)
CO2: 28 mEq/L (ref 19–32)
Calcium: 9.1 mg/dL (ref 8.4–10.5)
Chloride: 102 mEq/L (ref 96–112)
Creat: 0.75 mg/dL (ref 0.50–1.10)
GFR, Est African American: >89 mL/min
GFR, Est Non African American: >89 mL/min
Glucose, Bld: 88 mg/dL (ref 70–99)
Potassium: 4.4 mEq/L (ref 3.5–5.3)
Sodium: 137 mEq/L (ref 135–145)
Total Bilirubin: 0.4 mg/dL (ref 0.2–1.2)
Total Protein: 6.4 g/dL (ref 6.0–8.3)

## 2013-06-16 LAB — LIPID PANEL
Cholesterol: 191 mg/dL (ref 0–200)
HDL: 39 mg/dL — ABNORMAL LOW (ref >39)
LDL Cholesterol: 111 mg/dL — ABNORMAL HIGH (ref 0–99)
Total CHOL/HDL Ratio: 4.9 Ratio
Triglycerides: 206 mg/dL — ABNORMAL HIGH (ref <150)
VLDL: 41 mg/dL — ABNORMAL HIGH (ref 0–40)

## 2013-06-16 LAB — TSH: TSH: 2.751 u[IU]/mL (ref 0.350–4.500)

## 2013-06-22 ENCOUNTER — Telehealth: Payer: Self-pay

## 2013-06-22 ENCOUNTER — Encounter: Payer: Self-pay | Admitting: Internal Medicine

## 2013-06-22 NOTE — Telephone Encounter (Signed)
THIS MESSAGE IS FOR RENEE COE:  The patient called regarding an EOB from her insurance with regard to [facility] lab[facility] which was drawn here on 06/15/13.  She said the insurance company needs us to change the diagnosis on the labs to V70.0 to have the labs covered.  Can you do this for her?  Thank you!

## 2013-06-22 NOTE — Telephone Encounter (Signed)
Looks like V70.0 is his dx. Can you call billing tomorrow morning? If you don't get a chance, let me know and I'll do it when I get here at 1

## 2013-07-07 NOTE — Telephone Encounter (Signed)
[facility] [place] [facility] [place]

## 2013-10-30 ENCOUNTER — Other Ambulatory Visit: Payer: Self-pay | Admitting: Internal Medicine

## 2014-01-02 ENCOUNTER — Other Ambulatory Visit: Payer: Self-pay | Admitting: Internal Medicine

## 2014-01-03 NOTE — Telephone Encounter (Signed)
Called in.

## 2014-05-04 ENCOUNTER — Other Ambulatory Visit: Payer: Self-pay | Admitting: Internal Medicine

## 2014-05-05 NOTE — Telephone Encounter (Signed)
Notified pt of RFs and need for f/up bf RFs run out. Pt agreed and will call and set up appt.

## 2014-05-05 NOTE — Telephone Encounter (Signed)
Called in clonazepam.

## 2014-05-31 ENCOUNTER — Encounter: Payer: Managed Care, Other (non HMO) | Admitting: Internal Medicine

## 2014-07-05 ENCOUNTER — Encounter: Payer: Self-pay | Admitting: Internal Medicine

## 2014-07-05 ENCOUNTER — Ambulatory Visit (INDEPENDENT_AMBULATORY_CARE_PROVIDER_SITE_OTHER): Payer: Managed Care, Other (non HMO) | Admitting: Internal Medicine

## 2014-07-05 VITALS — BP 129/81 | HR 81 | Temp 98.3°F | Resp 16 | Ht 65.5 in | Wt 260.2 lb

## 2014-07-05 DIAGNOSIS — Z0000 Encounter for general adult medical examination without abnormal findings: Secondary | ICD-10-CM | POA: Diagnosis not present

## 2014-07-05 LAB — CBC WITH DIFFERENTIAL/PLATELET
Basophils Absolute: 0 10*3/uL (ref 0.0–0.1)
Basophils Relative: 0 % (ref 0–1)
Eosinophils Absolute: 0.6 10*3/uL (ref 0.0–0.7)
Eosinophils Relative: 5 % (ref 0–5)
HCT: 39.1 % (ref 36.0–46.0)
Hemoglobin: 12.8 g/dL (ref 12.0–15.0)
Lymphocytes Relative: 20 % (ref 12–46)
Lymphs Abs: 2.4 10*3/uL (ref 0.7–4.0)
MCH: 27.4 pg (ref 26.0–34.0)
MCHC: 32.7 g/dL (ref 30.0–36.0)
MCV: 83.5 fL (ref 78.0–100.0)
MPV: 10.9 fL (ref 8.6–12.4)
Monocytes Absolute: 0.8 10*3/uL (ref 0.1–1.0)
Monocytes Relative: 7 % (ref 3–12)
Neutro Abs: 8.1 10*3/uL — ABNORMAL HIGH (ref 1.7–7.7)
Neutrophils Relative %: 68 % (ref 43–77)
Platelets: 354 10*3/uL (ref 150–400)
RBC: 4.68 MIL/uL (ref 3.87–5.11)
RDW: 13.6 % (ref 11.5–15.5)
WBC: 11.9 10*3/uL — ABNORMAL HIGH (ref 4.0–10.5)

## 2014-07-05 LAB — COMPREHENSIVE METABOLIC PANEL
ALT: 17 U/L (ref 0–35)
AST: 15 U/L (ref 0–37)
Albumin: 4.3 g/dL (ref 3.5–5.2)
Alkaline Phosphatase: 53 U/L (ref 39–117)
BUN: 13 mg/dL (ref 6–23)
CO2: 27 mEq/L (ref 19–32)
Calcium: 9.2 mg/dL (ref 8.4–10.5)
Chloride: 103 mEq/L (ref 96–112)
Creat: 0.57 mg/dL (ref 0.50–1.10)
Glucose, Bld: 88 mg/dL (ref 70–99)
Potassium: 4.4 mEq/L (ref 3.5–5.3)
Sodium: 137 mEq/L (ref 135–145)
Total Bilirubin: 0.4 mg/dL (ref 0.2–1.2)
Total Protein: 7.3 g/dL (ref 6.0–8.3)

## 2014-07-05 LAB — LIPID PANEL
Cholesterol: 220 mg/dL — ABNORMAL HIGH (ref 0–200)
HDL: 36 mg/dL — ABNORMAL LOW (ref >=46)
LDL Cholesterol: 152 mg/dL — ABNORMAL HIGH (ref 0–99)
Total CHOL/HDL Ratio: 6.1 Ratio
Triglycerides: 159 mg/dL — ABNORMAL HIGH (ref <150)
VLDL: 32 mg/dL (ref 0–40)

## 2014-07-05 MED ORDER — CLONAZEPAM 0.5 MG PO TABS: 0.5000 mg | ORAL_TABLET | Freq: Two times a day (BID) | ORAL | Status: DC | PRN

## 2014-07-05 MED ORDER — FLUOXETINE HCL 20 MG PO TABS: 20.0000 mg | ORAL_TABLET | Freq: Every day | ORAL | Status: DC

## 2014-07-05 NOTE — Progress Notes (Signed)
 Subjective:    Patient ID: [name], female    DOB: 05/27/1980, 34 y.o.   MRN: [id number]  HPI here for annual exam Patient Active Problem List   Diagnosis Date Noted  . BMI 45.0-49.9, adult--- she has continued to work hard and is successful with her weight loss  Wt Readings from Last 3 Encounters:  07/05/14 260 lb 3.2 oz (118.026 kg)  06/15/13 280 lb (127.007 kg)  10/06/12 280 lb (127.007 kg)    06/24/2012  . Anxiety and depression--- she is improved ///needs less Klonopin. Continues on Prozac. Home life better.  06/24/2012  . History of nephrolithiasis--none in years  06/24/2012  . Other and unspecified hyperlipidemia 06/24/2012  . Vitamin D deficiency 05/07/2011   Prior to Admission medications   Medication Sig Start Date End Date Taking? Authorizing Provider  clonazePAM (KLONOPIN) 0.5 MG tablet Take 1 tablet (0.5 mg total) by mouth 2 (two) times daily as needed. for anxiety   Yes Robert P Doolittle, MD  Cranberry 1000 MG CAPS Take by mouth.   Yes Historical Provider, MD  FLUoxetine (PROZAC) 20 MG tablet Take 1 tablet (20 mg total) by mouth daily.   Yes Robert P Doolittle, MD  Nutritional Supplements (JUICE PLUS FIBRE PO) Take by mouth.   Yes Historical Provider, MD  OFF Phentermine  Up-to-date with Pat smear and tetanus and flu vaccine  Social history stable  Review of Systems  Constitutional: Negative.   Eyes: Negative.   Respiratory: Negative.   Cardiovascular: Negative.   Gastrointestinal: Positive for constipation.       Constipation is intermittent in response to dietary changes or stool softeners  Endocrine: Negative.   Genitourinary: Negative.   Musculoskeletal: Negative.   Skin: Negative.   Allergic/Immunologic: Negative.   Neurological: Positive for headaches.       Headaches are very occasional and do not interfere with activity she does not have an aura or change in vision with the headaches  Hematological: Negative.   Psychiatric/Behavioral:  Negative.        Objective:   Physical Exam  Constitutional: She is oriented to person, place, and time. She appears well-developed and well-nourished. No distress.  HENT:  Head: Normocephalic.  Right Ear: External ear normal.  Left Ear: External ear normal.  Nose: Nose normal.  Mouth/Throat: Oropharynx is clear and moist.  Eyes: Conjunctivae and EOM are normal. Pupils are equal, round, and reactive to light.  Neck: Normal range of motion. Neck supple. No thyromegaly present.  Cardiovascular: Normal rate, regular rhythm, normal heart sounds and intact distal pulses.   No murmur heard. Pulmonary/Chest: Effort normal and breath sounds normal. She has no wheezes.  Abdominal: Soft. Bowel sounds are normal. She exhibits no distension and no mass. There is no tenderness. There is no rebound.  Musculoskeletal: Normal range of motion. She exhibits no edema or tenderness.  Lymphadenopathy:    She has no cervical adenopathy.  Neurological: She is alert and oriented to person, place, and time. She has normal reflexes. No cranial nerve deficit.  Skin: Skin is warm and dry. No rash noted.  Psychiatric: She has a normal mood and affect. Her behavior is normal. Judgment and thought content normal.  Nursing note and vitals reviewed.  BP 129/81 mmHg  Pulse 81  Temp(Src) 98.3 F (36.8 C) (Oral)  Resp 16  Ht 5' 5.5" (1.664 m)  Wt 260 lb 3.2 oz (118.026 kg)  BMI 42.63 kg/m2  SpO2 99%          Assessment & Plan:  Annual exam Significant obesity History of hyperlipidemia History of depression with anxiety improved  Meds ordered this encounter  Medications  . Nutritional Supplements (JUICE PLUS FIBRE PO)    Sig: Take by mouth.  . Cranberry 1000 MG CAPS    Sig: Take by mouth.  . clonazePAM (KLONOPIN) 0.5 MG tablet    Sig: Take 1 tablet (0.5 mg total) by mouth 2 (two) times daily as needed. for anxiety    Dispense:  60 tablet    Refill:  5  . FLUoxetine (PROZAC) 20 MG tablet    Sig:  Take 1 tablet (20 mg total) by mouth daily.    Dispense:  90 tablet    Refill:  3   Notify lab results    Addendum labs Results for orders placed or performed in visit on 07/05/14  CBC with Differential/Platelet  Result Value Ref Range   WBC 11.9 (H) 4.0 - 10.5 K/uL   RBC 4.68 3.87 - 5.11 MIL/uL   Hemoglobin 12.8 12.0 - 15.0 g/dL   HCT 39.1 36.0 - 46.0 %   MCV 83.5 78.0 - 100.0 fL   MCH 27.4 26.0 - 34.0 pg   MCHC 32.7 30.0 - 36.0 g/dL   RDW 13.6 11.5 - 15.5 %   Platelets 354 150 - 400 K/uL   MPV 10.9 8.6 - 12.4 fL   Neutrophils Relative % 68 43 - 77 %   Neutro Abs 8.1 (H) 1.7 - 7.7 K/uL   Lymphocytes Relative 20 12 - 46 %   Lymphs Abs 2.4 0.7 - 4.0 K/uL   Monocytes Relative 7 3 - 12 %   Monocytes Absolute 0.8 0.1 - 1.0 K/uL   Eosinophils Relative 5 0 - 5 %   Eosinophils Absolute 0.6 0.0 - 0.7 K/uL   Basophils Relative 0 0 - 1 %   Basophils Absolute 0.0 0.0 - 0.1 K/uL   Smear Review Criteria for review not met   Comprehensive metabolic panel  Result Value Ref Range   Sodium 137 135 - 145 mEq/L   Potassium 4.4 3.5 - 5.3 mEq/L   Chloride 103 96 - 112 mEq/L   CO2 27 19 - 32 mEq/L   Glucose, Bld 88 70 - 99 mg/dL   BUN 13 6 - 23 mg/dL   Creat 0.57 0.50 - 1.10 mg/dL   Total Bilirubin 0.4 0.2 - 1.2 mg/dL   Alkaline Phosphatase 53 39 - 117 U/L   AST 15 0 - 37 U/L   ALT 17 0 - 35 U/L   Total Protein 7.3 6.0 - 8.3 g/dL   Albumin 4.3 3.5 - 5.2 g/dL   Calcium 9.2 8.4 - 10.5 mg/dL  Lipid panel  Result Value Ref Range   Cholesterol 220 (H) 0 - 200 mg/dL   Triglycerides 159 (H) <150 mg/dL   HDL 36 (L) >=46 mg/dL   Total CHOL/HDL Ratio 6.1 Ratio   VLDL 32 0 - 40 mg/dL   LDL Cholesterol 152 (H) 0 - 99 mg/dL    

## 2014-07-05 NOTE — Patient Instructions (Signed)
google [facility] and click on Integrative medicine

## 2014-07-06 ENCOUNTER — Encounter: Payer: Self-pay | Admitting: Internal Medicine

## 2014-07-10 ENCOUNTER — Encounter: Payer: Self-pay | Admitting: Internal Medicine

## 2014-07-17 ENCOUNTER — Other Ambulatory Visit: Payer: Self-pay | Admitting: Internal Medicine

## 2015-01-10 ENCOUNTER — Other Ambulatory Visit: Payer: Self-pay

## 2015-01-10 MED ORDER — CLONAZEPAM 0.5 MG PO TABS: 0.5000 mg | ORAL_TABLET | Freq: Two times a day (BID) | ORAL | Status: DC | PRN

## 2015-01-11 NOTE — Telephone Encounter (Signed)
Faxed

## 2015-02-05 ENCOUNTER — Encounter: Payer: Self-pay | Admitting: Internal Medicine

## 2015-02-27 ENCOUNTER — Telehealth: Payer: Self-pay

## 2015-02-27 NOTE — Telephone Encounter (Signed)
Patient left voicemail today at 1035 requesting a call back. She states she is a patient of Dr. Merla Richesoolittle and knows he is retiring soon. She would like to know how to go about signing a request for records. Please call back at [phone].

## 2015-02-27 NOTE — Telephone Encounter (Signed)
Left message for patient. She can come by and complete ROI form, or we can fax/mail form to her. If she would like us to fax/mail form for her to complete call back and we can do so.

## 2015-03-10 ENCOUNTER — Telehealth: Payer: Self-pay

## 2015-03-10 NOTE — Telephone Encounter (Signed)
PT was informed by Pharm that her klonopin could not be filled because it shows that she had received them at multiple pharmacies///PT states this is incorrect// is completely out of Rx and is requesting call back to get details straightened out.   [phone] Please call to advise

## 2015-03-12 NOTE — Telephone Encounter (Signed)
clonazePAM (KLONOPIN) 0.5 MG tablet [[id number]]      Order Details    Dose: 0.5 mg Route: Oral Frequency: 2 times daily PRN   Dispense Quantity:  60 tablet Refills:  5 Fills Remaining:  5          Sig: Take 1 tablet (0.5 mg total) by mouth 2 (two) times daily as needed. for anxiety         Written Date:  01/10/15 Expiration Date:  07/09/15     Start Date:  01/10/15 End Date:  --     Ordering Provider:  -- Authorizing Provider:  Tonye Pearsonobert P Doolittle, MD Ordering User:  Tonye Pearsonobert P Doolittle, MD         Called pharmacy they do not know why someone told her that. She states she will cal patient and fill Rx on file.

## 2015-03-12 NOTE — Telephone Encounter (Signed)
[facility] shows only 2 ref in 6 months so she is good--tell her to have pharm call if they won't refill

## 2015-06-20 ENCOUNTER — Ambulatory Visit (INDEPENDENT_AMBULATORY_CARE_PROVIDER_SITE_OTHER): Payer: Managed Care, Other (non HMO) | Admitting: Internal Medicine

## 2015-06-20 ENCOUNTER — Encounter: Payer: Self-pay | Admitting: Internal Medicine

## 2015-06-20 VITALS — BP 126/77 | HR 71 | Temp 98.8°F | Resp 16 | Ht 65.0 in | Wt 277.0 lb

## 2015-06-20 DIAGNOSIS — Z0000 Encounter for general adult medical examination without abnormal findings: Secondary | ICD-10-CM

## 2015-06-20 DIAGNOSIS — F32A Depression, unspecified: Secondary | ICD-10-CM

## 2015-06-20 DIAGNOSIS — F419 Anxiety disorder, unspecified: Secondary | ICD-10-CM

## 2015-06-20 DIAGNOSIS — E785 Hyperlipidemia, unspecified: Secondary | ICD-10-CM | POA: Diagnosis not present

## 2015-06-20 DIAGNOSIS — Z6841 Body Mass Index (BMI) 40.0 and over, adult: Secondary | ICD-10-CM | POA: Diagnosis not present

## 2015-06-20 DIAGNOSIS — F418 Other specified anxiety disorders: Secondary | ICD-10-CM | POA: Diagnosis not present

## 2015-06-20 DIAGNOSIS — E7800 Pure hypercholesterolemia, unspecified: Secondary | ICD-10-CM

## 2015-06-20 DIAGNOSIS — F329 Major depressive disorder, single episode, unspecified: Secondary | ICD-10-CM

## 2015-06-20 LAB — CBC WITH DIFFERENTIAL/PLATELET
Basophils Absolute: 0 cells/uL (ref 0–200)
Basophils Relative: 0 %
Eosinophils Absolute: 450 cells/uL (ref 15–500)
Eosinophils Relative: 5 %
HCT: 38.6 % (ref 35.0–45.0)
Hemoglobin: 12.6 g/dL (ref 11.7–15.5)
Lymphocytes Relative: 23 %
Lymphs Abs: 2070 cells/uL (ref 850–3900)
MCH: 26.9 pg — ABNORMAL LOW (ref 27.0–33.0)
MCHC: 32.6 g/dL (ref 32.0–36.0)
MCV: 82.5 fL (ref 80.0–100.0)
MPV: 11.1 fL (ref 7.5–12.5)
Monocytes Absolute: 540 cells/uL (ref 200–950)
Monocytes Relative: 6 %
Neutro Abs: 5940 cells/uL (ref 1500–7800)
Neutrophils Relative %: 66 %
Platelets: 338 10*3/uL (ref 140–400)
RBC: 4.68 MIL/uL (ref 3.80–5.10)
RDW: 13.7 % (ref 11.0–15.0)
WBC: 9 10*3/uL (ref 3.8–10.8)

## 2015-06-20 LAB — COMPREHENSIVE METABOLIC PANEL
ALT: 14 U/L (ref 6–29)
AST: 13 U/L (ref 10–30)
Albumin: 4.1 g/dL (ref 3.6–5.1)
Alkaline Phosphatase: 46 U/L (ref 33–115)
BUN: 15 mg/dL (ref 7–25)
CO2: 25 mmol/L (ref 20–31)
Calcium: 9 mg/dL (ref 8.6–10.2)
Chloride: 105 mmol/L (ref 98–110)
Creat: 0.6 mg/dL (ref 0.50–1.10)
Glucose, Bld: 89 mg/dL (ref 65–99)
Potassium: 4.2 mmol/L (ref 3.5–5.3)
Sodium: 138 mmol/L (ref 135–146)
Total Bilirubin: 0.4 mg/dL (ref 0.2–1.2)
Total Protein: 6.7 g/dL (ref 6.1–8.1)

## 2015-06-20 LAB — LIPID PANEL
Cholesterol: 216 mg/dL — ABNORMAL HIGH (ref 125–200)
HDL: 37 mg/dL — ABNORMAL LOW (ref >=46)
LDL Cholesterol: 149 mg/dL — ABNORMAL HIGH (ref <130)
Total CHOL/HDL Ratio: 5.8 Ratio — ABNORMAL HIGH (ref <=5.0)
Triglycerides: 151 mg/dL — ABNORMAL HIGH (ref <150)
VLDL: 30 mg/dL (ref <30)

## 2015-06-20 MED ORDER — FLUOXETINE HCL 20 MG PO CAPS: 20.0000 mg | ORAL_CAPSULE | Freq: Every day | ORAL | Status: DC

## 2015-06-20 MED ORDER — CLONAZEPAM 0.5 MG PO TABS: 0.5000 mg | ORAL_TABLET | Freq: Two times a day (BID) | ORAL | Status: DC | PRN

## 2015-06-20 NOTE — Patient Instructions (Signed)
     IF you received an x-ray today, you will receive an invoice from [facility]. Please contact [facility] at [phone] with questions or concerns regarding your invoice.   IF you received labwork today, you will receive an invoice from [facility]. Please contact [facility] at [phone] with questions or concerns regarding your invoice.   Our billing staff will not be able to assist you with questions regarding bills from these companies.  You will be contacted with the lab results as soon as they are available. The fastest way to get your results is to activate your My Chart account. Instructions are located on the last page of this paperwork. If you have not heard from us regarding the results in 2 weeks, please contact this office.      

## 2015-06-22 NOTE — Progress Notes (Signed)
   Subjective:    Patient ID: [name], female    DOB: 06/24/1980, 35 y.o.   MRN: [id number]  HPIcpe Patient Active Problem List   Diagnosis Date Noted  . BMI 45.0-49.9, adult (HCC) 06/24/2012  . Anxiety and depression--- feels that she is doing well with Prozac and Klonopin //still needs Klonopin to slow her mind down and fall asleep at night  06/24/2012  . History of nephrolithiasis 06/24/2012  . Hyperlipidemia 06/24/2012  . Vitamin D deficiency 05/07/2011   Doing well//better this year Still working on [employer]weight Wt Readings from Last 3 Encounters:  06/20/15 277 lb (125.646 kg)  07/05/14 260 lb 3.2 oz (118.026 kg)  06/15/13 280 lb (127.007 kg)   GYN care up-to-date Immunizations up-to-date  Social and family history stable  Review of Systems 14 point review of systems performed is negative    Objective:   Physical Exam  Constitutional: She is oriented to person, place, and time. She appears well-developed and well-nourished. No distress.  obese  HENT:  Head: Normocephalic.  Right Ear: External ear normal.  Left Ear: External ear normal.  Nose: Nose normal.  Mouth/Throat: Oropharynx is clear and moist.  Eyes: Conjunctivae and EOM are normal. Pupils are equal, round, and reactive to light.  Neck: Normal range of motion. Neck supple. No thyromegaly present.  Cardiovascular: Normal rate, regular rhythm, normal heart sounds and intact distal pulses.   No murmur heard. Pulmonary/Chest: Effort normal and breath sounds normal. She has no wheezes.  Abdominal: Soft. Bowel sounds are normal. She exhibits no distension and no mass. There is no tenderness. There is no rebound.  Musculoskeletal: Normal range of motion. She exhibits no edema or tenderness.  Lymphadenopathy:    She has no cervical adenopathy.  Neurological: She is alert and oriented to person, place, and time. She has normal reflexes. No cranial nerve deficit.  Skin: Skin is warm and dry. No rash noted.    Psychiatric: She has a normal mood and affect. Her behavior is normal. Judgment and thought content normal.  Nursing note and vitals reviewed. BP 126/77 mmHg  Pulse 71  Temp(Src) 98.8 F (37.1 C)  Resp 16  Ht 5\' 5"  (1.651 m)  Wt 277 lb (125.646 kg)  BMI 46.10 kg/m2         Assessment & Plan:  Annual physical exam - Plan: CBC with Differential/Platelet, Comprehensive metabolic panel  Elevated LDL cholesterol level - Plan: Lipid panel  Anxiety and depression  BMI 45.0-49.9, adult (HCC)  Hyperlipidemia  Meds ordered this encounter  Medications  . finasteride (PROPECIA) 1 MG tabletPer dermatology for hair loss     Sig: Take 1 mg by mouth daily.  . clonazePAM (KLONOPIN) 0.5 MG tablet    Sig: Take 1 tablet (0.5 mg total) by mouth 2 (two) times daily as needed. for anxiety    Dispense:  60 tablet    Refill:  5  . finasteride (PROSCAR) 5 MG tablet    Sig: TK 1/2 T PO ONCE D    Refill:  3  . FLUoxetine (PROZAC) 20 MG capsule    Sig: Take 1 capsule (20 mg total) by mouth daily.    Dispense:  90 capsule    Refill:  3   We discussed follow-up with new primary care provider

## 2015-06-26 ENCOUNTER — Encounter: Payer: Self-pay | Admitting: Internal Medicine

## 2015-07-04 ENCOUNTER — Encounter: Payer: Managed Care, Other (non HMO) | Admitting: Internal Medicine

## 2016-01-09 ENCOUNTER — Other Ambulatory Visit: Payer: Self-pay

## 2016-01-09 NOTE — Telephone Encounter (Signed)
Last ov and lab 06/2015 Last refill 06/2015 with 5 refills.

## 2016-01-09 NOTE — Telephone Encounter (Signed)
Rtc

## 2016-01-10 NOTE — Telephone Encounter (Signed)
Pt has a new pcp but cant get in to see thm until 03/2016 until eligible for exam. Asks me to send this message to Benny Lennertsarah weber to see if she can get 2 months worth until then. I advised she needs an ov

## 2016-01-14 ENCOUNTER — Other Ambulatory Visit: Payer: Self-pay

## 2016-01-14 NOTE — Telephone Encounter (Signed)
Msg is for Weber  Pt is getting a refill from doolittle, clonazePAM (KLONOPIN) 0.5 MG tablet. She would like for her to take a look at it and give her a call back.  Please advise  [phone]

## 2016-01-15 NOTE — Telephone Encounter (Signed)
07/2015 last ov and refill with additional

## 2016-01-17 MED ORDER — CLONAZEPAM 0.5 MG PO TABS: 0.5000 mg | ORAL_TABLET | Freq: Two times a day (BID) | ORAL | 0 refills | Status: DC | PRN

## 2016-01-17 NOTE — Telephone Encounter (Signed)
I will give her a month and then she will need OV

## 2016-01-17 NOTE — Telephone Encounter (Signed)
I have already done this refill

## 2016-01-18 NOTE — Telephone Encounter (Signed)
Spoke w/pt.  [facility] [address]

## 2017-04-11 NOTE — Progress Notes (Addendum)
[facility] [address], [address] [place], [place] [address] [phone] [phone]  Date:  04/15/2017   Name:  [name]   DOB:  05/08/1980   MRN:  [id number]  PCP:  Pearline Cablesopland, Jamie Sanzo C, MD    Chief Complaint: Establish Care   History of Present Illness:  [name] is a 37 y.o. very pleasant female patient who presents with the following:  Here today as a new patient to establish care- her past PCP DR. Merla RichesDoolittle has retired She is overall doing well History of obesity, hyperlipidemia, vitamin D def [facility]  She works for [employer] In her free time she enjoys reading, church She has 2 children- 9 and 526 yo.  They attend the same school which is helpful  Her IUD was removed- her husband had a vasectomy  She is using a cranberry supplement and this helps to keep her kidney stones away - no recnet issues with stones  She is fasting today for labs  She sees Dr. Henderson CloudHorvath for her pap Pap was 4/18 she thinks  She notes some more issues with focusing and getting things done over the last few years No memory concerns No history of AHDH She is quite busy between work and family.  Discussed this with her- at this time she does not want to pursue testing for ADHD, but she will keep me posted about this   Patient Active Problem List   Diagnosis Date Noted  . BMI 45.0-49.9, adult (HCC) 06/24/2012  . Anxiety and depression 06/24/2012  . History of nephrolithiasis 06/24/2012  . Hyperlipidemia 06/24/2012  . Vitamin D deficiency 05/07/2011    Past Medical History:  Diagnosis Date  . Anxiety   . Depression   . Kidney stones     Past Surgical History:  Procedure Laterality Date  . BREAST SURGERY    . CESAREAN SECTION  6/09  . CHOLECYSTECTOMY    . EYE SURGERY      Social History   Tobacco Use  . Smoking status: Former Games developermoker  . Smokeless tobacco: Former Engineer, waterUser  Substance Use Topics  .  Alcohol use: Yes  . Drug use: No    Family History  Problem Relation Age of Onset  . Diabetes Father   . Kidney disease Father   . Heart disease Father   . Diabetes Paternal Grandmother   . Cervical cancer Sister     No Known Allergies  Medication list has been reviewed and updated.  Current Outpatient Medications on File Prior to Visit  Medication Sig Dispense Refill  . Cranberry 1000 MG CAPS Take by mouth.    Marland Kitchen. FLUoxetine (PROZAC) 20 MG capsule Take 1 capsule (20 mg total) by mouth daily. 90 capsule 3  . Nutritional Supplements (JUICE PLUS FIBRE PO) Take by mouth.     No current facility-administered medications on file prior to visit.     Review of Systems:  As per HPI- otherwise negative. No fever or chills No CP or SOB    Physical Examination: Vitals:   04/15/17 0938  BP: 128/80  Pulse: 72  Resp: 16  Temp: 98.3 F (36.8 Kane)  SpO2: 98%   Vitals:   04/15/17 0938  Weight: 248 lb 3.2 oz (112.6 kg)  Height: 5\' 5"  (1.651 m)   Body mass index is 41.3 kg/m. Ideal Body Weight: Weight in (lb) to have BMI = 25: 149.9  GEN: WDWN, NAD, Non-toxic, A & O x 3, obese, looks well  HEENT: Atraumatic, Normocephalic. Neck supple. No masses, No LAD.  Bilateral TM wnl, oropharynx normal.  PEERL,EOMI.   Ears and Nose: No external deformity. CV: RRR, No M/G/R. No JVD. No thrill. No extra heart sounds. PULM: CTA B, no wheezes, crackles, rhonchi. No retractions. No resp. distress. No accessory muscle use. ABD: S, NT, ND. No rebound. No HSM. EXTR: No Kane/Kane/e NEURO Normal gait.  PSYCH: Normally interactive. Conversant. Not depressed or anxious appearing.  Calm demeanor.    Assessment and Plan: Screening for diabetes mellitus - Plan: Comprehensive metabolic panel, Hemoglobin A1c  Screening for hyperlipidemia - Plan: Lipid panel  Vitamin D deficiency - Plan: Vitamin D (25 hydroxy)  Screening for deficiency anemia - Plan: CBC  Inattention  Here today to re-establish care   Routine labs today She is feeling well, mood is good, no real concerns today except for some difficulty paying attention and getting things done.  For the time being she prefers to keep an eye on this but will let me know if she desires any further evaluation  Will plan further follow- up pending labs.   Signed Abbe Amsterdam, MD  Received her labs 2/6 Results for orders placed or performed in visit on 04/15/17  Comprehensive metabolic panel  Result Value Ref Range   Sodium 140 135 - 145 mEq/L   Potassium 4.2 3.5 - 5.1 mEq/L   Chloride 104 96 - 112 mEq/L   CO2 29 19 - 32 mEq/L   Glucose, Bld 92 70 - 99 mg/dL   BUN 16 6 - 23 mg/dL   Creatinine, Ser 1.61 0.40 - 1.20 mg/dL   Total Bilirubin 0.4 0.2 - 1.2 mg/dL   Alkaline Phosphatase 45 39 - 117 U/L   AST 14 0 - 37 U/L   ALT 14 0 - 35 U/L   Total Protein 6.8 6.0 - 8.3 g/dL   Albumin 3.9 3.5 - 5.2 g/dL   Calcium 9.0 8.4 - 09.6 mg/dL   GFR 045.40 >98.11 mL/min  CBC  Result Value Ref Range   WBC 8.8 4.0 - 10.5 K/uL   RBC 4.60 3.87 - 5.11 Mil/uL   Platelets 284.0 150.0 - 400.0 K/uL   Hemoglobin 12.7 12.0 - 15.0 g/dL   HCT 91.4 78.2 - 95.6 %   MCV 82.8 78.0 - 100.0 fl   MCHC 33.3 30.0 - 36.0 g/dL   RDW 21.3 08.6 - 57.8 %  Hemoglobin A1c  Result Value Ref Range   Hgb A1c MFr Bld 5.5 4.6 - 6.5 %  Lipid panel  Result Value Ref Range   Cholesterol 186 0 - 200 mg/dL   Triglycerides 469.6 0.0 - 149.0 mg/dL   HDL 29.52 (L) >84.13 mg/dL   VLDL 24.4 0.0 - 01.0 mg/dL   LDL Cholesterol 272 (H) 0 - 99 mg/dL   Total CHOL/HDL Ratio 5    NonHDL 149.31   Vitamin D (25 hydroxy)  Result Value Ref Range   VITD 18.03 (L) 30.00 - 100.00 ng/mL

## 2017-04-15 ENCOUNTER — Ambulatory Visit (INDEPENDENT_AMBULATORY_CARE_PROVIDER_SITE_OTHER): Payer: 59 | Admitting: Family Medicine

## 2017-04-15 ENCOUNTER — Encounter: Payer: Self-pay | Admitting: Family Medicine

## 2017-04-15 VITALS — BP 128/80 | HR 72 | Temp 98.3°F | Resp 16 | Ht 65.0 in | Wt 248.2 lb

## 2017-04-15 DIAGNOSIS — Z131 Encounter for screening for diabetes mellitus: Secondary | ICD-10-CM

## 2017-04-15 DIAGNOSIS — E559 Vitamin D deficiency, unspecified: Secondary | ICD-10-CM | POA: Diagnosis not present

## 2017-04-15 DIAGNOSIS — Z130 Encounter for screening for diseases of the blood and blood-forming organs and certain disorders involving the immune mechanism: Secondary | ICD-10-CM

## 2017-04-15 DIAGNOSIS — Z0000 Encounter for general adult medical examination without abnormal findings: Secondary | ICD-10-CM | POA: Diagnosis not present

## 2017-04-15 DIAGNOSIS — R41840 Attention and concentration deficit: Secondary | ICD-10-CM | POA: Diagnosis not present

## 2017-04-15 DIAGNOSIS — Z13220 Encounter for screening for lipoid disorders: Secondary | ICD-10-CM | POA: Diagnosis not present

## 2017-04-15 LAB — COMPREHENSIVE METABOLIC PANEL
ALT: 14 U/L (ref 0–35)
AST: 14 U/L (ref 0–37)
Albumin: 3.9 g/dL (ref 3.5–5.2)
Alkaline Phosphatase: 45 U/L (ref 39–117)
BUN: 16 mg/dL (ref 6–23)
CO2: 29 mEq/L (ref 19–32)
Calcium: 9 mg/dL (ref 8.4–10.5)
Chloride: 104 mEq/L (ref 96–112)
Creatinine, Ser: 0.66 mg/dL (ref 0.40–1.20)
GFR: 106.53 mL/min (ref >60.00)
Glucose, Bld: 92 mg/dL (ref 70–99)
Potassium: 4.2 mEq/L (ref 3.5–5.1)
Sodium: 140 mEq/L (ref 135–145)
Total Bilirubin: 0.4 mg/dL (ref 0.2–1.2)
Total Protein: 6.8 g/dL (ref 6.0–8.3)

## 2017-04-15 LAB — CBC
HCT: 38.1 % (ref 36.0–46.0)
Hemoglobin: 12.7 g/dL (ref 12.0–15.0)
MCHC: 33.3 g/dL (ref 30.0–36.0)
MCV: 82.8 fl (ref 78.0–100.0)
Platelets: 284 10*3/uL (ref 150.0–400.0)
RBC: 4.6 Mil/uL (ref 3.87–5.11)
RDW: 13.6 % (ref 11.5–15.5)
WBC: 8.8 10*3/uL (ref 4.0–10.5)

## 2017-04-15 LAB — LIPID PANEL
Cholesterol: 186 mg/dL (ref 0–200)
HDL: 37.1 mg/dL — ABNORMAL LOW (ref >39.00)
LDL Cholesterol: 126 mg/dL — ABNORMAL HIGH (ref 0–99)
NonHDL: 149.31
Total CHOL/HDL Ratio: 5
Triglycerides: 117 mg/dL (ref 0.0–149.0)
VLDL: 23.4 mg/dL (ref 0.0–40.0)

## 2017-04-15 LAB — HEMOGLOBIN A1C: Hgb A1c MFr Bld: 5.5 % (ref 4.6–6.5)

## 2017-04-15 LAB — VITAMIN D 25 HYDROXY (VIT D DEFICIENCY, FRACTURES): VITD: 18.03 ng/mL — ABNORMAL LOW (ref 30.00–100.00)

## 2017-04-15 MED ORDER — VITAMIN D (ERGOCALCIFEROL) 1.25 MG (50000 UNIT) PO CAPS: 50000.0000 [IU] | ORAL_CAPSULE | ORAL | 0 refills | Status: DC

## 2017-04-15 NOTE — Addendum Note (Signed)
Addended by: Abbe AmsterdamOPLAND, JESSICA C on: 04/15/2017 08:30 PM   Modules accepted: Orders

## 2017-04-15 NOTE — Patient Instructions (Signed)
Good to see you again today! We will get labs for you and I'll be in touch with your results asap

## 2017-05-12 ENCOUNTER — Encounter: Payer: Self-pay | Admitting: Family Medicine

## 2017-05-12 NOTE — Telephone Encounter (Signed)
[place], could this be a coding issue?

## 2017-05-12 NOTE — Addendum Note (Signed)
Addended by: Abbe AmsterdamOPLAND, Murray Guzzetta C on: 05/12/2017 08:53 AM   Modules accepted: Level of Service

## 2017-06-09 ENCOUNTER — Encounter: Payer: Self-pay | Admitting: Family Medicine

## 2017-06-10 NOTE — Telephone Encounter (Signed)
I certainly hear you and understand why you need this to be resolved!   I am again reaching out to my contacts in billing and my office manager to try and get this fixed for you

## 2017-07-09 ENCOUNTER — Encounter: Payer: Self-pay | Admitting: *Deleted

## 2017-07-09 DIAGNOSIS — M722 Plantar fascial fibromatosis: Secondary | ICD-10-CM

## 2017-07-14 ENCOUNTER — Other Ambulatory Visit: Payer: Self-pay | Admitting: Family Medicine

## 2017-07-14 DIAGNOSIS — E559 Vitamin D deficiency, unspecified: Secondary | ICD-10-CM

## 2017-10-02 ENCOUNTER — Other Ambulatory Visit: Payer: Self-pay | Admitting: Family Medicine

## 2017-10-02 DIAGNOSIS — E559 Vitamin D deficiency, unspecified: Secondary | ICD-10-CM

## 2018-09-27 DIAGNOSIS — Z01419 Encounter for gynecological examination (general) (routine) without abnormal findings: Secondary | ICD-10-CM | POA: Diagnosis not present

## 2018-09-27 DIAGNOSIS — Z0000 Encounter for general adult medical examination without abnormal findings: Secondary | ICD-10-CM | POA: Diagnosis not present

## 2018-11-18 DIAGNOSIS — L03012 Cellulitis of left finger: Secondary | ICD-10-CM | POA: Diagnosis not present

## 2018-12-19 DIAGNOSIS — Z20828 Contact with and (suspected) exposure to other viral communicable diseases: Secondary | ICD-10-CM | POA: Diagnosis not present

## 2018-12-24 ENCOUNTER — Other Ambulatory Visit: Payer: Self-pay

## 2018-12-24 DIAGNOSIS — Z20828 Contact with and (suspected) exposure to other viral communicable diseases: Secondary | ICD-10-CM | POA: Diagnosis not present

## 2018-12-24 DIAGNOSIS — Z20822 Contact with and (suspected) exposure to covid-19: Secondary | ICD-10-CM

## 2018-12-26 ENCOUNTER — Encounter: Payer: Self-pay | Admitting: Family Medicine

## 2018-12-26 ENCOUNTER — Telehealth: Payer: Self-pay | Admitting: Family Medicine

## 2018-12-26 LAB — NOVEL CORONAVIRUS, NAA: SARS-CoV-2, NAA: DETECTED — AB

## 2018-12-26 NOTE — Telephone Encounter (Signed)
Called from the call center to let us know this patient's covid 19 test is positive  I called pt and the call went straight to voicemail  Left message about test, inst to isolater herself and go to ED if severe symptoms like sob or high fever  Will send to pcp

## 2018-12-26 NOTE — Telephone Encounter (Signed)
Thanks [place]!

## 2018-12-29 ENCOUNTER — Other Ambulatory Visit: Payer: Self-pay

## 2018-12-29 DIAGNOSIS — Z20822 Contact with and (suspected) exposure to covid-19: Secondary | ICD-10-CM

## 2018-12-30 LAB — NOVEL CORONAVIRUS, NAA: SARS-CoV-2, NAA: NOT DETECTED

## 2019-06-30 DIAGNOSIS — J029 Acute pharyngitis, unspecified: Secondary | ICD-10-CM | POA: Diagnosis not present

## 2019-08-23 DIAGNOSIS — L570 Actinic keratosis: Secondary | ICD-10-CM | POA: Diagnosis not present

## 2019-08-23 DIAGNOSIS — D2372 Other benign neoplasm of skin of left lower limb, including hip: Secondary | ICD-10-CM | POA: Diagnosis not present

## 2019-08-23 DIAGNOSIS — D2239 Melanocytic nevi of other parts of face: Secondary | ICD-10-CM | POA: Diagnosis not present

## 2019-09-07 ENCOUNTER — Encounter: Payer: Self-pay | Admitting: Surgery

## 2019-10-06 DIAGNOSIS — Z0000 Encounter for general adult medical examination without abnormal findings: Secondary | ICD-10-CM | POA: Diagnosis not present

## 2019-10-10 ENCOUNTER — Other Ambulatory Visit (HOSPITAL_COMMUNITY): Payer: Self-pay | Admitting: Surgery

## 2019-10-10 ENCOUNTER — Other Ambulatory Visit: Payer: Self-pay | Admitting: Surgery

## 2019-10-12 ENCOUNTER — Encounter: Payer: No Typology Code available for payment source | Attending: Surgery | Admitting: Skilled Nursing Facility1

## 2019-10-12 ENCOUNTER — Other Ambulatory Visit: Payer: Self-pay

## 2019-10-12 DIAGNOSIS — E669 Obesity, unspecified: Secondary | ICD-10-CM | POA: Diagnosis not present

## 2019-10-12 NOTE — Progress Notes (Signed)
Nutrition Assessment for Bariatric Surgery Medical Nutrition Therapy  Patient was seen on 10/12/2019 for Pre-Operative Nutrition Assessment. Letter of approval faxed to [facility] Surgery bariatric surgery program coordinator on 10/12/2019   Referral stated Supervised Weight Loss (SWL) visits needed: 0  Planned surgery: sleeve gastrectomy  Pt expectation of surgery: to lose weight Pt expectation of dietitian: none stated    NUTRITION ASSESSMENT   Anthropometrics  Start weight at [facility]: 390 lbs (date: 10/12/2019)  Height: 66 in BMI: 48.26 kg/m2     Clinical  Medical hx: anxiety, depression, kidney stones, reflux  Medications: see list  Labs:  Notable signs/symptoms: headaches, back pain, balding   Any previous deficiencies? Yes, vitamin D  Micronutrient Nutrition Focused Physical Exam: Hair: No issues observed Eyes: No issues observed Mouth: No issues observed Neck: No issues observed Nails: No issues observed Skin: No issues observed  Lifestyle & Dietary Hx    24-Hr Dietary Recall First Meal: coffee + bagel + butter Snack: crackers Second Meal: chicken + rice Snack: chocolate or cookies Third Meal: steak + green bean + mashed potatoes Snack: ice cream  Beverages: coffee, sugar free lemonade, water   Estimated Energy Needs Calories: 1600   NUTRITION DIAGNOSIS  Overweight/obesity ([place]-3.3) related to past poor dietary habits and physical inactivity as evidenced by patient w/ planned sleeve gastrectomy surgery following dietary guidelines for continued weight loss.    NUTRITION INTERVENTION  Nutrition counseling (C-1) and education (E-2) to facilitate bariatric surgery goals.   Pre-Op Goals Reviewed with the Patient . Track food and beverage intake (pen and paper, MyFitness Pal, Baritastic app, etc.) . Make healthy food choices while monitoring portion sizes . Consume 3 meals per day or try to eat every 3-5 hours . Avoid concentrated sugars and fried  foods . Keep sugar & fat in the single digits per serving on food labels . Practice CHEWING your food (aim for applesauce consistency) . Practice not drinking 15 minutes before, during, and 30 minutes after each meal and snack . Avoid all carbonated beverages (ex: soda, sparkling beverages)  . Limit caffeinated beverages (ex: coffee, tea, energy drinks) . Avoid all sugar-sweetened beverages (ex: regular soda, sports drinks)  . Avoid alcohol  . Aim for 64-100 ounces of FLUID daily (with at least half of fluid intake being plain water)  . Aim for at least 60-80 grams of PROTEIN daily . Look for a liquid protein source that contains ?15 g protein and ?5 g carbohydrate (ex: shakes, drinks, shots) . Make a list of non-food related activities . Physical activity is an important part of a healthy lifestyle so keep it moving! The goal is to reach 150 minutes of exercise per week, including cardiovascular and weight baring activity.  *Goals that are bolded indicate the pt would like to start working towards these  Handouts Provided Include  . Bariatric Surgery handouts (Nutrition Visits, Pre-Op Goals, Protein Shakes, Vitamins & Minerals); mindful meals check list  Learning Style & Readiness for Change Teaching method utilized: Visual & Auditory  Demonstrated degree of understanding via: Teach Back  Barriers to learning/adherence to lifestyle change: none identified   RD's Notes for Next Visit    MONITORING & EVALUATION Dietary intake, weekly physical activity, body weight, and pre-op goals reached at next nutrition visit.    Next Steps  Patient is to follow up at [facility] for Pre-Op Class >2 weeks before surgery for further nutrition education.

## 2019-10-18 ENCOUNTER — Other Ambulatory Visit: Payer: Self-pay

## 2019-10-18 ENCOUNTER — Ambulatory Visit (HOSPITAL_COMMUNITY)
Admission: RE | Admit: 2019-10-18 | Discharge: 2019-10-18 | Disposition: A | Discharge status: Home / Self Care | Payer: No Typology Code available for payment source | Source: Ambulatory Visit | Attending: Surgery | Admitting: Surgery

## 2019-10-18 DIAGNOSIS — Z01818 Encounter for other preprocedural examination: Secondary | ICD-10-CM | POA: Diagnosis not present

## 2019-10-20 DIAGNOSIS — R350 Frequency of micturition: Secondary | ICD-10-CM | POA: Diagnosis not present

## 2019-10-20 DIAGNOSIS — Z124 Encounter for screening for malignant neoplasm of cervix: Secondary | ICD-10-CM | POA: Diagnosis not present

## 2019-10-20 DIAGNOSIS — Z01419 Encounter for gynecological examination (general) (routine) without abnormal findings: Secondary | ICD-10-CM | POA: Diagnosis not present

## 2019-10-20 DIAGNOSIS — Z1231 Encounter for screening mammogram for malignant neoplasm of breast: Secondary | ICD-10-CM | POA: Diagnosis not present

## 2019-10-20 LAB — HM PAP SMEAR: HPV, high-risk: NEGATIVE

## 2019-10-20 LAB — RESULTS CONSOLE HPV: CHL HPV: NEGATIVE

## 2019-11-16 ENCOUNTER — Ambulatory Visit (INDEPENDENT_AMBULATORY_CARE_PROVIDER_SITE_OTHER): Payer: No Typology Code available for payment source | Admitting: Psychology

## 2019-11-16 DIAGNOSIS — R69 Illness, unspecified: Secondary | ICD-10-CM | POA: Diagnosis not present

## 2019-11-16 DIAGNOSIS — F509 Eating disorder, unspecified: Secondary | ICD-10-CM

## 2019-11-24 DIAGNOSIS — D485 Neoplasm of uncertain behavior of skin: Secondary | ICD-10-CM | POA: Diagnosis not present

## 2019-11-24 DIAGNOSIS — L280 Lichen simplex chronicus: Secondary | ICD-10-CM | POA: Diagnosis not present

## 2019-11-30 ENCOUNTER — Ambulatory Visit (INDEPENDENT_AMBULATORY_CARE_PROVIDER_SITE_OTHER): Payer: No Typology Code available for payment source | Admitting: Psychology

## 2019-11-30 DIAGNOSIS — F509 Eating disorder, unspecified: Secondary | ICD-10-CM

## 2020-02-03 DIAGNOSIS — Z135 Encounter for screening for eye and ear disorders: Secondary | ICD-10-CM | POA: Diagnosis not present

## 2020-02-03 DIAGNOSIS — H52229 Regular astigmatism, unspecified eye: Secondary | ICD-10-CM | POA: Diagnosis not present

## 2020-02-06 ENCOUNTER — Ambulatory Visit: Payer: No Typology Code available for payment source

## 2020-02-12 DIAGNOSIS — R69 Illness, unspecified: Secondary | ICD-10-CM | POA: Diagnosis not present

## 2020-02-13 DIAGNOSIS — R69 Illness, unspecified: Secondary | ICD-10-CM | POA: Diagnosis not present

## 2020-03-12 ENCOUNTER — Encounter: Payer: No Typology Code available for payment source | Attending: Surgery | Admitting: Skilled Nursing Facility1

## 2020-03-12 ENCOUNTER — Other Ambulatory Visit: Payer: Self-pay

## 2020-03-12 DIAGNOSIS — E669 Obesity, unspecified: Secondary | ICD-10-CM | POA: Diagnosis not present

## 2020-03-14 NOTE — Progress Notes (Signed)
Pre-Operative Nutrition Class:  Appt start time: 7972   End time:  1830.  Patient was seen on 03/12/2020 for Pre-Operative Bariatric Surgery Education at the [facility].    Surgery date:  Surgery type: sleeve Start weight at [facility]: 390 (10/12/2019) Weight today: 291.8  Samples given per MNT protocol. Patient educated on appropriate usage: Celebrate Calcium  Lot # [id number] Exp:09/28/2020  Procare Multivitamin Lot #[id number] Exp:03/2021  Protein02 drink Shake Lot #[id number] Exp: 10/23  The following the learning objectives were met by the patient during this course:  Identify Pre-Op Dietary Goals and will begin 2 weeks pre-operatively  Identify appropriate sources of fluids and proteins   State protein recommendations and appropriate sources pre and post-operatively  Identify Post-Operative Dietary Goals and will follow for 2 weeks post-operatively  Identify appropriate multivitamin and calcium sources  Describe the need for physical activity post-operatively and will follow MD recommendations  State when to call healthcare provider regarding medication questions or post-operative complications  Handouts given during class include:  Pre-Op Bariatric Surgery Diet Handout  Protein Shake Handout  Post-Op Bariatric Surgery Nutrition Handout  BELT Program Information Flyer  Support Group Information Flyer  [facility] Bariatric Supplements Price List  Follow-Up Plan: Patient will follow-up at [facility] 2 weeks post operatively for diet advancement per MD.

## 2020-03-20 ENCOUNTER — Ambulatory Visit: Payer: Self-pay | Admitting: Surgery

## 2020-03-20 DIAGNOSIS — M9901 Segmental and somatic dysfunction of cervical region: Secondary | ICD-10-CM | POA: Diagnosis not present

## 2020-03-20 DIAGNOSIS — M546 Pain in thoracic spine: Secondary | ICD-10-CM | POA: Diagnosis not present

## 2020-03-20 DIAGNOSIS — M9902 Segmental and somatic dysfunction of thoracic region: Secondary | ICD-10-CM | POA: Diagnosis not present

## 2020-03-20 DIAGNOSIS — M542 Cervicalgia: Secondary | ICD-10-CM | POA: Diagnosis not present

## 2020-03-20 NOTE — H&P (Signed)
Surgical Evaluation  Chief Complaint: morbid obesity  HPI: Follow up regarding surgical management of morbid obesity. Has completed the pre-operative pathway as below with no barriers identified. She reports no significant changes in her health since our last meeting which was over 6 months ago.  She is here today with her husband and they have several insightful questions to discuss.  Her husband is actually beginning the bariatric pathway with Dr. Kinsinger.  CXR/UGI: 10/18/19: suspect small hiatal hernia Labs: (HgA1c, lipid panel, CMP, CBC, TSH/T4 July 2021) reviewed, unremarkable Dietician: approved (Alexis Scotece August 2021, preop class done 03/12/20) Psych: approved (Trevor Barker at [facility] 11/16/19 and then Sandy Ellingson at [facility] 02/12/20)  She scored very low on the STOP-BANG questionnaire-no indication to get a sleep study.  Initial visit 09/07/19: This is a very pleasant 39-year-old woman with chief complaint of morbid obesity.  She has been struggling with this disease since childhood.  She has a strong family history of obesity, in fact her sister had a sleeve gastrectomy about 6 years ago.  She has been considering surgery for about 5 years, and did initiate the process last year, however the global pandemic delayed her plans. Medical history includes high cholesterol and depression, which is more related to her weight.  She has tried multiple methods of weight loss, beginning at age 9 with Weight Watchers.  She has done Weight Watchers 5 or 6 times since then, as well as [place] fitness, Slim fast, various workout programs, calorie counting etc.  She has also tried phentermine.  Her most successful attempts have resulted in 40-50 pounds of weight loss but she ultimately has regained all of this weight. She is hoping to effectively treat this disease in order to avoid developing more severe comorbidities, to prolong and improve the quality of her life.  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  She lives at home with her husband and her 2 children, ages 9 and 12.  Quit smoking in 2015.  Surgical history includes laparoscopic cholecystectomy 20 years ago, breast reduction and C-section 2.  286.38lb/ BMI 47.65  No Known Allergies  Past Medical History:  Diagnosis Date   Anxiety    Depression    Kidney stones     Past Surgical History:  Procedure Laterality Date   BREAST SURGERY     CESAREAN SECTION  6/09   CHOLECYSTECTOMY     EYE SURGERY      Family History  Problem Relation Age of Onset   Diabetes Father    Kidney disease Father    Heart disease Father    Diabetes Paternal Grandmother    Cervical cancer Sister     Social History   Socioeconomic History   Marital status: Married    Spouse name: Not on file   Number of children: Not on file   Years of education: Not on file   Highest education level: Not on file  Occupational History   Occupation: P[employer]    Employer: [employer]  Tobacco Use   Smoking status: Former Smoker   Smokeless tobacco: Former User  Substance and Sexual Activity   Alcohol use: Yes   Drug use: No   Sexual activity: Not on file  Other Topics Concern   Not on file  Social History Narrative   Married   1 son   1 daughter   Social Determinants of Health   Financial Resource Strain: Not on file  Food Insecurity: Not on file  Transportation Needs: Not   on file  Physical Activity: Not on file  Stress: Not on file  Social Connections: Not on file    Current Outpatient Medications on File Prior to Visit  Medication Sig Dispense Refill   Cranberry 1000 MG CAPS Take by mouth.     FLUoxetine (PROZAC) 20 MG capsule Take 1 capsule (20 mg total) by mouth daily. 90 capsule 3   Nutritional Supplements (JUICE PLUS FIBRE PO) Take by mouth.     Vitamin D, Ergocalciferol, (DRISDOL) 50000 units CAPS capsule TAKE 1 CAPSULE BY MOUTH EVERY 7 DAYS. 12 capsule 4   No current facility-administered medications on file  prior to visit.    Review of Systems: a complete, 10pt review of systems was completed with pertinent positives and negatives as documented in the HPI  Physical Exam: Alert and cooperative 293lb  CBC Latest Ref Rng & Units 04/15/2017 06/20/2015 07/05/2014  WBC 4.0 - 10.5 K/uL 8.8 9.0 11.9(H)  Hemoglobin 12.0 - 15.0 g/dL 22.6 33.3 54.5  Hematocrit 36.0 - 46.0 % 38.1 38.6 39.1  Platelets 150.0 - 400.0 K/uL 284.0 338 354    CMP Latest Ref Rng & Units 04/15/2017 06/20/2015 07/05/2014  Glucose 70 - 99 mg/dL 92 89 88  BUN 6 - 23 mg/dL 16 15 13   Creatinine 0.40 - 1.20 mg/dL 6.25 6.38  Sodium 135 - 145 mEq/L 140 138 137  Potassium 3.5 - 5.1 mEq/L 4.2 4.2 4.4  Chloride 96 - 112 mEq/L 104 105 103  CO2 19 - 32 mEq/L 29 25 27   Calcium 8.4 - 10.5 mg/dL 9.0 9.0 9.2  Total Protein 6.0 - 8.3 g/dL 6.8 6.7 7.3  Total Bilirubin 0.2 - 1.2 mg/dL 0.4 0.4 0.4  Alkaline Phos 39 - 117 U/L 45 46 53  AST 0 - 37 U/L 14 13 15   ALT 0 - 35 U/L 14 14 17     No results found for: INR, PROTIME  Imaging: No results found.   A/P:  MORBID OBESITY (E66.01) Story: She remains a good candidate for sleeve gastrectomy. We have previously discussed the surgery including technical aspects, the risks of bleeding, infection, pain, scarring, injury to intra-abdominal structures, staple line leak or abscess, chronic abdominal pain or nausea, new onset or worsened GERD, DVT/PE, pneumonia, heart attack, stroke, death, failure to reach weight loss goals and weight regain, hernia. Discussed the typical pre-, peri-, and postoperative course. Discussed the importance of lifelong behavioral changes to combat the chronic and relapsing disease which is obesity. Questions were answered.  Plan to proceed as scheduled with laparoscopic sleeve gastrectomy. For her height, BMI of 30=> 180lb.      Patient Active Problem List   Diagnosis Date Noted   Plantar fasciitis 07/09/2017   BMI 45.0-49.9, adult (HCC) 06/24/2012   Anxiety and  depression 06/24/2012   History of nephrolithiasis 06/24/2012   Hyperlipidemia 06/24/2012   Vitamin D deficiency 05/07/2011       06/26/2012, MD [facility]  See AMION to contact appropriate on-call provider

## 2020-03-20 NOTE — H&P (View-Only) (Signed)
Surgical Evaluation  Chief Complaint: morbid obesity  HPI: Follow up regarding surgical management of morbid obesity. Has completed the pre-operative pathway as below with no barriers identified. She reports no significant changes in her health since our last meeting which was over 6 months ago.  She is here today with her husband and they have several insightful questions to discuss.  Her husband is actually beginning the bariatric pathway with Dr. Sheliah Hatch.  CXR/UGI: 10/18/19: suspect small hiatal hernia Labs: (HgA1c, lipid panel, CMP, CBC, TSH/T4 July 2021) reviewed, unremarkable Dietician: approved Jon Gills [facility] August 2021, preop class done 03/12/20) Psych: approved Helmut Muster at [place] 11/16/19 and then Veronda Prude at [facility] 02/12/20)  She scored very low on the STOP-BANG questionnaire-no indication to get a sleep study.  Initial visit 09/07/19: This is a very pleasant 40 year old woman with chief complaint of morbid obesity.  She has been struggling with this disease since childhood.  She has a strong family history of obesity, in fact her sister had a sleeve gastrectomy about 6 years ago.  She has been considering surgery for about 5 years, and did initiate the process last year, however the global pandemic delayed her plans. Medical history includes high cholesterol and depression, which is more related to her weight.  She has tried multiple methods of weight loss, beginning at age 69 with Weight Watchers.  She has done Weight Watchers 5 or 6 times since then, as well as [place] fitness, Slim fast, various workout programs, calorie counting etc.  She has also tried phentermine.  Her most successful attempts have resulted in 40-50 pounds of weight loss but she ultimately has regained all of this weight. She is hoping to effectively treat this disease in order to avoid developing more severe comorbidities, to prolong and improve the quality of her life.  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  She lives at home with her husband and her 2 children, ages 34 and 59.  Quit smoking in 2015.  Surgical history includes laparoscopic cholecystectomy 20 years ago, breast reduction and C-section 2.  286.38lb/ BMI 47.65  No Known Allergies  Past Medical History:  Diagnosis Date   Anxiety    Depression    Kidney stones     Past Surgical History:  Procedure Laterality Date   BREAST SURGERY     CESAREAN SECTION  6/09   CHOLECYSTECTOMY     EYE SURGERY      Family History  Problem Relation Age of Onset   Diabetes Father    Kidney disease Father    Heart disease Father    Diabetes Paternal Grandmother    Cervical cancer Sister     Social History   Socioeconomic History   Marital status: Married    Spouse name: Not on file   Number of children: Not on file   Years of education: Not on file   Highest education level: Not on file  Occupational History   Occupation: [employer]    Employer: [employer]  Tobacco Use   Smoking status: Former Smoker   Smokeless tobacco: Former Engineer, water and Sexual Activity   Alcohol use: Yes   Drug use: No   Sexual activity: Not on file  Other Topics Concern   Not on file  Social History Narrative   Married   1 son   1 daughter   Social Determinants of Health   Financial Resource Strain: Not on file  Food Insecurity: Not on file  Transportation Needs: Not  on file  Physical Activity: Not on file  Stress: Not on file  Social Connections: Not on file    Current Outpatient Medications on File Prior to Visit  Medication Sig Dispense Refill   Cranberry 1000 MG CAPS Take by mouth.     FLUoxetine (PROZAC) 20 MG capsule Take 1 capsule (20 mg total) by mouth daily. 90 capsule 3   Nutritional Supplements (JUICE PLUS FIBRE PO) Take by mouth.     Vitamin D, Ergocalciferol, (DRISDOL) 50000 units CAPS capsule TAKE 1 CAPSULE BY MOUTH EVERY 7 DAYS. 12 capsule 4   No current facility-administered medications on file  prior to visit.    Review of Systems: a complete, 10pt review of systems was completed with pertinent positives and negatives as documented in the HPI  Physical Exam: Alert and cooperative 293lb  CBC Latest Ref Rng & Units 04/15/2017 06/20/2015 07/05/2014  WBC 4.0 - 10.5 K/uL 8.8 9.0 11.9(H)  Hemoglobin 12.0 - 15.0 g/dL 22.6 33.3 54.5  Hematocrit 36.0 - 46.0 % 38.1 38.6 39.1  Platelets 150.0 - 400.0 K/uL 284.0 338 354    CMP Latest Ref Rng & Units 04/15/2017 06/20/2015 07/05/2014  Glucose 70 - 99 mg/dL 92 89 88  BUN 6 - 23 mg/dL 16 15 13   Creatinine 0.40 - 1.20 mg/dL 6.25 6.38  Sodium 135 - 145 mEq/L 140 138 137  Potassium 3.5 - 5.1 mEq/L 4.2 4.2 4.4  Chloride 96 - 112 mEq/L 104 105 103  CO2 19 - 32 mEq/L 29 25 27   Calcium 8.4 - 10.5 mg/dL 9.0 9.0 9.2  Total Protein 6.0 - 8.3 g/dL 6.8 6.7 7.3  Total Bilirubin 0.2 - 1.2 mg/dL 0.4 0.4 0.4  Alkaline Phos 39 - 117 U/L 45 46 53  AST 0 - 37 U/L 14 13 15   ALT 0 - 35 U/L 14 14 17     No results found for: INR, PROTIME  Imaging: No results found.   A/P:  MORBID OBESITY (E66.01) Story: She remains a good candidate for sleeve gastrectomy. We have previously discussed the surgery including technical aspects, the risks of bleeding, infection, pain, scarring, injury to intra-abdominal structures, staple line leak or abscess, chronic abdominal pain or nausea, new onset or worsened GERD, DVT/PE, pneumonia, heart attack, stroke, death, failure to reach weight loss goals and weight regain, hernia. Discussed the typical pre-, peri-, and postoperative course. Discussed the importance of lifelong behavioral changes to combat the chronic and relapsing disease which is obesity. Questions were answered.  Plan to proceed as scheduled with laparoscopic sleeve gastrectomy. For her height, BMI of 30=> 180lb.      Patient Active Problem List   Diagnosis Date Noted   Plantar fasciitis 07/09/2017   BMI 45.0-49.9, adult (HCC) 06/24/2012   Anxiety and  depression 06/24/2012   History of nephrolithiasis 06/24/2012   Hyperlipidemia 06/24/2012   Vitamin D deficiency 05/07/2011       06/26/2012, MD [facility]  See AMION to contact appropriate on-call provider

## 2020-03-22 DIAGNOSIS — M9901 Segmental and somatic dysfunction of cervical region: Secondary | ICD-10-CM | POA: Diagnosis not present

## 2020-03-22 DIAGNOSIS — M9902 Segmental and somatic dysfunction of thoracic region: Secondary | ICD-10-CM | POA: Diagnosis not present

## 2020-03-22 DIAGNOSIS — M542 Cervicalgia: Secondary | ICD-10-CM | POA: Diagnosis not present

## 2020-03-22 DIAGNOSIS — M546 Pain in thoracic spine: Secondary | ICD-10-CM | POA: Diagnosis not present

## 2020-03-28 DIAGNOSIS — M546 Pain in thoracic spine: Secondary | ICD-10-CM | POA: Diagnosis not present

## 2020-03-28 DIAGNOSIS — M9901 Segmental and somatic dysfunction of cervical region: Secondary | ICD-10-CM | POA: Diagnosis not present

## 2020-03-28 DIAGNOSIS — M542 Cervicalgia: Secondary | ICD-10-CM | POA: Diagnosis not present

## 2020-03-28 DIAGNOSIS — M9902 Segmental and somatic dysfunction of thoracic region: Secondary | ICD-10-CM | POA: Diagnosis not present

## 2020-03-29 DIAGNOSIS — M9901 Segmental and somatic dysfunction of cervical region: Secondary | ICD-10-CM | POA: Diagnosis not present

## 2020-03-29 DIAGNOSIS — M542 Cervicalgia: Secondary | ICD-10-CM | POA: Diagnosis not present

## 2020-03-29 DIAGNOSIS — M546 Pain in thoracic spine: Secondary | ICD-10-CM | POA: Diagnosis not present

## 2020-03-29 DIAGNOSIS — M9902 Segmental and somatic dysfunction of thoracic region: Secondary | ICD-10-CM | POA: Diagnosis not present

## 2020-04-02 NOTE — Patient Instructions (Addendum)
DUE TO COVID-19 ONLY ONE VISITOR IS ALLOWED TO COME WITH YOU AND STAY IN THE WAITING ROOM ONLY DURING PRE OP AND PROCEDURE DAY OF SURGERY. THE 1 VISITOR  MAY VISIT WITH YOU AFTER SURGERY IN YOUR PRIVATE ROOM DURING VISITING HOURS ONLY!  YOU NEED TO HAVE A COVID 19 TEST ON__1/28_____ @_10 :00______, THIS TEST MUST BE DONE BEFORE SURGERY,  COVID TESTING SITE [address] [place] , IT IS ON THE RIGHT GOING OUT [address] APPROXIMATELY  2 MINUTES PAST [employer] ON THE RIGHT. ONCE YOUR COVID TEST IS COMPLETED,  PLEASE BEGIN THE QUARANTINE INSTRUCTIONS AS OUTLINED IN YOUR HANDOUT.                [id number]    Your procedure is scheduled on: 04/10/20   Report to [facility] Main  Entrance   Report to admitting at  8:45 AM     Call this number if you have problems the morning of surgery [phone]     BRUSH YOUR TEETH MORNING OF SURGERY AND RINSE YOUR MOUTH OUT, NO CHEWING GUM CANDY OR MINTS .   NO SOLID FOOD AFTER 6:00 PM THE NIGHT BEFORE YOUR SURGERY.   YOU MAY DRINK CLEAR FLUIDS until 7:30 am  THE G2 DRINK YOU DRINK BEFORE YOU LEAVE HOME WILL BE THE LAST FLUIDS YOU DRINK BEFORE SURGERY.  PAIN IS EXPECTED AFTER SURGERY AND WILL NOT BE COMPLETELY ELIMINATED.   AMBULATION AND TYLENOL WILL HELP REDUCE INCISIONAL AND GAS PAIN. MOVEMENT IS KEY!  YOU ARE EXPECTED TO BE OUT OF BED WITHIN 4 HOURS OF ADMISSION TO YOUR PATIENT ROOM.  SITTING IN THE RECLINER THROUGHOUT THE DAY IS IMPORTANT FOR DRINKING FLUIDS AND MOVING GAS THROUGHOUT THE GI TRACT.  COMPRESSION STOCKINGS SHOULD BE WORN [facility] STAY UNLESS YOU ARE WALKING.   INCENTIVE SPIROMETER SHOULD BE USED EVERY HOUR WHILE AWAKE TO DECREASE POST-OPERATIVE COMPLICATIONS SUCH AS PNEUMONIA.  WHEN DISCHARGED HOME, IT IS IMPORTANT TO CONTINUE TO WALK EVERY HOUR AND USE THE INCENTIVE SPIROMETER EVERY HOUR.   Take these medicines the morning of surgery with A SIP OF WATER:  Prozac, Protonix                                 You may not have any metal on your body including hair pins and              piercings  Do not wear jewelry, make-up, lotions, powders or perfumes, deodorant             Do not wear nail polish on your fingernails.  Do not shave  48 hours prior to surgery.             .   Do not bring valuables to the hospital. [facility] IS NOT             RESPONSIBLE   FOR VALUABLES.  Contacts, dentures or bridgework may not be worn into surgery.        Special Instructions: N/A              Please read over the following fact sheets you were given: _____________________________________________________________________             [facility] - Preparing for Surgery Before surgery, you can play an important role.   Because skin is not sterile, your skin needs to be as free of  germs as possible.   You can reduce the number of germs on your skin by washing with CHG (chlorahexidine gluconate) soap before surgery.   CHG is an antiseptic cleaner which kills germs and bonds with the skin to continue killing germs even after washing. Please DO NOT use if you have an allergy to CHG or antibacterial soaps .  If your skin becomes reddened/irritated stop using the CHG and inform your nurse when you arrive at Short Stay. Do not shave (including legs and underarms) for at least 48 hours prior to the first CHG shower.   Please follow these instructions carefully:  1.  Shower with CHG Soap the night before surgery and the  morning of Surgery.  2.  If you choose to wash your hair, wash your hair first as usual with your  normal  shampoo.  3.  After you shampoo, rinse your hair and body thoroughly to remove the  shampoo.                                        4.  Use CHG as you would any other liquid soap.  You can apply chg directly  to the skin and wash                       Gently with a scrungie or clean washcloth.  5.  Apply the CHG Soap to your body ONLY  FROM THE NECK DOWN.   Do not use on face/ open                           Wound or open sores. Avoid contact with eyes, ears mouth and genitals (private parts).                       Wash face,  Genitals (private parts) with your normal soap.             6.  Wash thoroughly, paying special attention to the area where your surgery  will be performed.  7.  Thoroughly rinse your body with warm water from the neck down.  8.  DO NOT shower/wash with your normal soap after using and rinsing off  the CHG Soap.             9.  Pat yourself dry with a clean towel.            10.  Wear clean pajamas.            11.  Place clean sheets on your bed the night of your first shower and do not  sleep with pets. Day of Surgery : Do not apply any lotions/deodorants the morning of surgery.  Please wear clean clothes to the hospital/surgery center.  FAILURE TO FOLLOW THESE INSTRUCTIONS MAY RESULT IN THE CANCELLATION OF YOUR SURGERY PATIENT SIGNATURE_________________________________  NURSE SIGNATURE__________________________________  ________________________________________________________________________   [name]  An incentive spirometer is a tool that can help keep your lungs clear and active. This tool measures how well you are filling your lungs with each breath. Taking long deep breaths may help reverse or decrease the chance of developing breathing (pulmonary) problems (especially infection) following:  A long period of time when you are unable to move or be active. BEFORE THE PROCEDURE   If the spirometer includes an  indicator to show your best effort, your nurse or respiratory therapist will set it to a desired goal.  If possible, sit up straight or lean slightly forward. Try not to slouch.  Hold the incentive spirometer in an upright position. INSTRUCTIONS FOR USE  1. Sit on the edge of your bed if possible, or sit up as far as you can in bed or on a chair. 2. Hold the incentive  spirometer in an upright position. 3. Breathe out normally. 4. Place the mouthpiece in your mouth and seal your lips tightly around it. 5. Breathe in slowly and as deeply as possible, raising the piston or the ball toward the top of the column. 6. Hold your breath for 3-5 seconds or for as long as possible. Allow the piston or ball to fall to the bottom of the column. 7. Remove the mouthpiece from your mouth and breathe out normally. 8. Rest for a few seconds and repeat Steps 1 through 7 at least 10 times every 1-2 hours when you are awake. Take your time and take a few normal breaths between deep breaths. 9. The spirometer may include an indicator to show your best effort. Use the indicator as a goal to work toward during each repetition. 10. After each set of 10 deep breaths, practice coughing to be sure your lungs are clear. If you have an incision (the cut made at the time of surgery), support your incision when coughing by placing a pillow or rolled up towels firmly against it. Once you are able to get out of bed, walk around indoors and cough well. You may stop using the incentive spirometer when instructed by your caregiver.  RISKS AND COMPLICATIONS  Take your time so you do not get dizzy or light-headed.  If you are in pain, you may need to take or ask for pain medication before doing incentive spirometry. It is harder to take a deep breath if you are having pain. AFTER USE  Rest and breathe slowly and easily.  It can be helpful to keep track of a log of your progress. Your caregiver can provide you with a simple table to help with this. If you are using the spirometer at home, follow these instructions: SEEK MEDICAL CARE IF:   You are having difficultly using the spirometer.  You have trouble using the spirometer as often as instructed.  Your pain medication is not giving enough relief while using the spirometer.  You develop fever of 100.5 F (38.1 C) or higher. SEEK  IMMEDIATE MEDICAL CARE IF:   You cough up bloody sputum that had not been present before.  You develop fever of 102 F (38.9 C) or greater.  You develop worsening pain at or near the incision site. MAKE SURE YOU:   Understand these instructions.  Will watch your condition.  Will get help right away if you are not doing well or get worse. Document Released: 07/07/2006 Document Revised: 05/19/2011 Document Reviewed: 09/07/2006 [facility] Patient Information 2014 [place], [place].   ________________________________________________________________________

## 2020-04-03 ENCOUNTER — Encounter (HOSPITAL_COMMUNITY)
Admission: RE | Admit: 2020-04-03 | Discharge: 2020-04-03 | Disposition: A | Discharge status: Home / Self Care | Payer: No Typology Code available for payment source | Source: Ambulatory Visit | Attending: Surgery | Admitting: Surgery

## 2020-04-03 ENCOUNTER — Encounter (HOSPITAL_COMMUNITY): Payer: Self-pay

## 2020-04-03 ENCOUNTER — Other Ambulatory Visit: Payer: Self-pay

## 2020-04-03 DIAGNOSIS — M9901 Segmental and somatic dysfunction of cervical region: Secondary | ICD-10-CM | POA: Diagnosis not present

## 2020-04-03 DIAGNOSIS — Z01812 Encounter for preprocedural laboratory examination: Secondary | ICD-10-CM | POA: Diagnosis not present

## 2020-04-03 DIAGNOSIS — M9902 Segmental and somatic dysfunction of thoracic region: Secondary | ICD-10-CM | POA: Diagnosis not present

## 2020-04-03 DIAGNOSIS — M546 Pain in thoracic spine: Secondary | ICD-10-CM | POA: Diagnosis not present

## 2020-04-03 DIAGNOSIS — M542 Cervicalgia: Secondary | ICD-10-CM | POA: Diagnosis not present

## 2020-04-03 HISTORY — DX: Nausea with vomiting, unspecified: R11.2

## 2020-04-03 HISTORY — DX: Personal history of urinary calculi: Z87.442

## 2020-04-03 HISTORY — DX: Gastro-esophageal reflux disease without esophagitis: K21.9

## 2020-04-03 HISTORY — DX: Other specified postprocedural states: Z98.890

## 2020-04-03 HISTORY — DX: Personal history of other diseases of the digestive system: Z87.19

## 2020-04-03 LAB — COMPREHENSIVE METABOLIC PANEL
ALT: 21 U/L (ref 0–44)
AST: 20 U/L (ref 15–41)
Albumin: 3.9 g/dL (ref 3.5–5.0)
Alkaline Phosphatase: 49 U/L (ref 38–126)
Anion gap: 9 (ref 5–15)
BUN: 23 mg/dL — ABNORMAL HIGH (ref 6–20)
CO2: 24 mmol/L (ref 22–32)
Calcium: 9.3 mg/dL (ref 8.9–10.3)
Chloride: 105 mmol/L (ref 98–111)
Creatinine, Ser: 0.8 mg/dL (ref 0.44–1.00)
GFR, Estimated: >60 mL/min (ref >60)
Glucose, Bld: 92 mg/dL (ref 70–99)
Potassium: 3.8 mmol/L (ref 3.5–5.1)
Sodium: 138 mmol/L (ref 135–145)
Total Bilirubin: 0.5 mg/dL (ref 0.3–1.2)
Total Protein: 7.2 g/dL (ref 6.5–8.1)

## 2020-04-03 LAB — CBC WITH DIFFERENTIAL/PLATELET
Abs Immature Granulocytes: 0.03 10*3/uL (ref 0.00–0.07)
Basophils Absolute: 0.1 10*3/uL (ref 0.0–0.1)
Basophils Relative: 1 %
Eosinophils Absolute: 0.5 10*3/uL (ref 0.0–0.5)
Eosinophils Relative: 5 %
HCT: 37.4 % (ref 36.0–46.0)
Hemoglobin: 11.5 g/dL — ABNORMAL LOW (ref 12.0–15.0)
Immature Granulocytes: 0 %
Lymphocytes Relative: 18 %
Lymphs Abs: 1.7 10*3/uL (ref 0.7–4.0)
MCH: 24.9 pg — ABNORMAL LOW (ref 26.0–34.0)
MCHC: 30.7 g/dL (ref 30.0–36.0)
MCV: 81.1 fL (ref 80.0–100.0)
Monocytes Absolute: 0.7 10*3/uL (ref 0.1–1.0)
Monocytes Relative: 7 %
Neutro Abs: 6.4 10*3/uL (ref 1.7–7.7)
Neutrophils Relative %: 69 %
Platelets: 353 10*3/uL (ref 150–400)
RBC: 4.61 MIL/uL (ref 3.87–5.11)
RDW: 15.1 % (ref 11.5–15.5)
WBC: 9.5 10*3/uL (ref 4.0–10.5)
nRBC: 0 % (ref 0.0–0.2)

## 2020-04-03 NOTE — Progress Notes (Signed)
COVID Vaccine Completed:No Date COVID Vaccine completed: COVID vaccine manufacturer: Pfizer    Moderna   [employer]   PCP -  Dr.  Shanda Bumps Copland  Cardiologist - none  Chest x-ray - 10/18/19-epic EKG - 10/18/19-epic Stress Test - no ECHO - no Cardiac Cath - no Pacemaker/ICD device last checked:NA  Sleep Study - no CPAP -   Fasting Blood Sugar - NA Checks Blood Sugar _____ times a day  Blood Thinner Instructions:NA Aspirin Instructions: Last Dose:  Anesthesia review:   Patient denies shortness of breath, fever, cough and chest pain at PAT appointment yes  Patient verbalized understanding of instructions that were given to them at the PAT appointment. Patient was also instructed that they will need to review over the PAT instructions again at home before surgery.Yes Pt is able to climb one flight of stairs because of her BMI but no SOB doing housework or ADLs

## 2020-04-04 DIAGNOSIS — M546 Pain in thoracic spine: Secondary | ICD-10-CM | POA: Diagnosis not present

## 2020-04-04 DIAGNOSIS — M9902 Segmental and somatic dysfunction of thoracic region: Secondary | ICD-10-CM | POA: Diagnosis not present

## 2020-04-04 DIAGNOSIS — M542 Cervicalgia: Secondary | ICD-10-CM | POA: Diagnosis not present

## 2020-04-04 DIAGNOSIS — M9901 Segmental and somatic dysfunction of cervical region: Secondary | ICD-10-CM | POA: Diagnosis not present

## 2020-04-05 DIAGNOSIS — M9901 Segmental and somatic dysfunction of cervical region: Secondary | ICD-10-CM | POA: Diagnosis not present

## 2020-04-05 DIAGNOSIS — M542 Cervicalgia: Secondary | ICD-10-CM | POA: Diagnosis not present

## 2020-04-05 DIAGNOSIS — M546 Pain in thoracic spine: Secondary | ICD-10-CM | POA: Diagnosis not present

## 2020-04-05 DIAGNOSIS — M9902 Segmental and somatic dysfunction of thoracic region: Secondary | ICD-10-CM | POA: Diagnosis not present

## 2020-04-06 ENCOUNTER — Other Ambulatory Visit (HOSPITAL_COMMUNITY)
Admission: RE | Admit: 2020-04-06 | Discharge: 2020-04-06 | Disposition: A | Discharge status: Home / Self Care | Payer: No Typology Code available for payment source | Source: Ambulatory Visit | Attending: Surgery | Admitting: Surgery

## 2020-04-06 DIAGNOSIS — Z20822 Contact with and (suspected) exposure to covid-19: Secondary | ICD-10-CM | POA: Diagnosis not present

## 2020-04-06 DIAGNOSIS — Z01812 Encounter for preprocedural laboratory examination: Secondary | ICD-10-CM | POA: Insufficient documentation

## 2020-04-06 LAB — SARS CORONAVIRUS 2 (TAT 6-24 HRS): SARS Coronavirus 2: NEGATIVE

## 2020-04-09 DIAGNOSIS — M9902 Segmental and somatic dysfunction of thoracic region: Secondary | ICD-10-CM | POA: Diagnosis not present

## 2020-04-09 DIAGNOSIS — M542 Cervicalgia: Secondary | ICD-10-CM | POA: Diagnosis not present

## 2020-04-09 DIAGNOSIS — M9901 Segmental and somatic dysfunction of cervical region: Secondary | ICD-10-CM | POA: Diagnosis not present

## 2020-04-09 DIAGNOSIS — M546 Pain in thoracic spine: Secondary | ICD-10-CM | POA: Diagnosis not present

## 2020-04-09 MED ORDER — BUPIVACAINE LIPOSOME 1.3 % IJ SUSP
20.0000 mL | Freq: Once | INTRAMUSCULAR | Status: DC
  Filled 2020-04-09: qty 20

## 2020-04-10 ENCOUNTER — Other Ambulatory Visit: Payer: Self-pay

## 2020-04-10 ENCOUNTER — Encounter (HOSPITAL_COMMUNITY)
Admission: RE | Disposition: A | Discharge status: Home / Self Care | Payer: Self-pay | Source: Home / Self Care | Attending: Surgery

## 2020-04-10 ENCOUNTER — Encounter (HOSPITAL_COMMUNITY): Payer: Self-pay | Admitting: Surgery

## 2020-04-10 ENCOUNTER — Inpatient Hospital Stay (HOSPITAL_COMMUNITY): Payer: No Typology Code available for payment source | Admitting: Anesthesiology

## 2020-04-10 ENCOUNTER — Inpatient Hospital Stay (HOSPITAL_COMMUNITY)
Admission: RE | Admit: 2020-04-10 | Discharge: 2020-04-11 | DRG: 621 | Disposition: A | Discharge status: Home / Self Care | Payer: No Typology Code available for payment source | Attending: Surgery | Admitting: Surgery

## 2020-04-10 DIAGNOSIS — R69 Illness, unspecified: Secondary | ICD-10-CM | POA: Diagnosis not present

## 2020-04-10 DIAGNOSIS — Z6841 Body Mass Index (BMI) 40.0 and over, adult: Secondary | ICD-10-CM | POA: Diagnosis not present

## 2020-04-10 DIAGNOSIS — K219 Gastro-esophageal reflux disease without esophagitis: Secondary | ICD-10-CM | POA: Diagnosis present

## 2020-04-10 DIAGNOSIS — Z87442 Personal history of urinary calculi: Secondary | ICD-10-CM | POA: Diagnosis not present

## 2020-04-10 DIAGNOSIS — K449 Diaphragmatic hernia without obstruction or gangrene: Secondary | ICD-10-CM | POA: Diagnosis present

## 2020-04-10 DIAGNOSIS — E559 Vitamin D deficiency, unspecified: Secondary | ICD-10-CM | POA: Diagnosis not present

## 2020-04-10 DIAGNOSIS — F32A Depression, unspecified: Secondary | ICD-10-CM | POA: Diagnosis present

## 2020-04-10 DIAGNOSIS — Z87891 Personal history of nicotine dependence: Secondary | ICD-10-CM

## 2020-04-10 HISTORY — PX: LAPAROSCOPIC GASTRIC SLEEVE RESECTION: SHX5895

## 2020-04-10 HISTORY — PX: UPPER GI ENDOSCOPY: SHX6162

## 2020-04-10 LAB — PREGNANCY, URINE: Preg Test, Ur: NEGATIVE

## 2020-04-10 LAB — TYPE AND SCREEN
ABO/RH(D): A POS
Antibody Screen: NEGATIVE

## 2020-04-10 LAB — ABO/RH: ABO/RH(D): A POS

## 2020-04-10 SURGERY — GASTRECTOMY, SLEEVE, LAPAROSCOPIC
Anesthesia: General | Site: Abdomen

## 2020-04-10 MED ORDER — GABAPENTIN 300 MG PO CAPS
300.0000 mg | ORAL_CAPSULE | ORAL | Status: AC
  Administered 2020-04-10: 300 mg via ORAL
  Filled 2020-04-10: qty 1

## 2020-04-10 MED ORDER — HYDRALAZINE HCL 20 MG/ML IJ SOLN: 10.0000 mg | INTRAMUSCULAR | Status: DC | PRN

## 2020-04-10 MED ORDER — LACTATED RINGERS IR SOLN
Status: DC | PRN
  Administered 2020-04-10: 1000 mL

## 2020-04-10 MED ORDER — HEPARIN SODIUM (PORCINE) 5000 UNIT/ML IJ SOLN
5000.0000 [IU] | INTRAMUSCULAR | Status: AC
  Administered 2020-04-10: 5000 [IU] via SUBCUTANEOUS
  Filled 2020-04-10: qty 1

## 2020-04-10 MED ORDER — METOCLOPRAMIDE HCL 5 MG/ML IJ SOLN
10.0000 mg | Freq: Four times a day (QID) | INTRAMUSCULAR | Status: DC
  Administered 2020-04-10 – 2020-04-11 (×2): 10 mg via INTRAVENOUS
  Filled 2020-04-10 (×2): qty 2

## 2020-04-10 MED ORDER — PROPOFOL 10 MG/ML IV BOLUS
INTRAVENOUS | Status: AC
  Filled 2020-04-10: qty 20

## 2020-04-10 MED ORDER — ROCURONIUM BROMIDE 10 MG/ML (PF) SYRINGE
PREFILLED_SYRINGE | INTRAVENOUS | Status: AC
  Filled 2020-04-10: qty 10

## 2020-04-10 MED ORDER — FENTANYL CITRATE (PF) 250 MCG/5ML IJ SOLN
INTRAMUSCULAR | Status: DC | PRN
  Administered 2020-04-10: 50 ug via INTRAVENOUS
  Administered 2020-04-10: 100 ug via INTRAVENOUS
  Administered 2020-04-10: 50 ug via INTRAVENOUS

## 2020-04-10 MED ORDER — OXYCODONE HCL 5 MG/5ML PO SOLN: 5.0000 mg | Freq: Once | ORAL | Status: DC | PRN

## 2020-04-10 MED ORDER — OXYCODONE HCL 5 MG PO TABS: 5.0000 mg | ORAL_TABLET | Freq: Once | ORAL | Status: DC | PRN

## 2020-04-10 MED ORDER — ONDANSETRON HCL 4 MG/2ML IJ SOLN: 4.0000 mg | INTRAMUSCULAR | Status: DC | PRN

## 2020-04-10 MED ORDER — OXYCODONE HCL 5 MG/5ML PO SOLN
5.0000 mg | Freq: Four times a day (QID) | ORAL | Status: DC | PRN
  Filled 2020-04-10: qty 5

## 2020-04-10 MED ORDER — DOCUSATE SODIUM 100 MG PO CAPS
100.0000 mg | ORAL_CAPSULE | Freq: Two times a day (BID) | ORAL | Status: DC
  Administered 2020-04-10 – 2020-04-11 (×2): 100 mg via ORAL
  Filled 2020-04-10 (×2): qty 1

## 2020-04-10 MED ORDER — METHOCARBAMOL 1000 MG/10ML IJ SOLN
500.0000 mg | Freq: Four times a day (QID) | INTRAVENOUS | Status: DC | PRN
  Filled 2020-04-10: qty 5

## 2020-04-10 MED ORDER — SUGAMMADEX SODIUM 500 MG/5ML IV SOLN
INTRAVENOUS | Status: DC | PRN
  Administered 2020-04-10: 300 mg via INTRAVENOUS

## 2020-04-10 MED ORDER — HYDROMORPHONE HCL 1 MG/ML IJ SOLN: 0.5000 mg | Freq: Four times a day (QID) | INTRAMUSCULAR | Status: DC | PRN

## 2020-04-10 MED ORDER — ACETAMINOPHEN 500 MG PO TABS
1000.0000 mg | ORAL_TABLET | ORAL | Status: AC
  Administered 2020-04-10: 1000 mg via ORAL
  Filled 2020-04-10: qty 2

## 2020-04-10 MED ORDER — CHLORHEXIDINE GLUCONATE 4 % EX LIQD: 60.0000 mL | Freq: Once | CUTANEOUS | Status: DC

## 2020-04-10 MED ORDER — ROCURONIUM BROMIDE 10 MG/ML (PF) SYRINGE
PREFILLED_SYRINGE | INTRAVENOUS | Status: DC | PRN
  Administered 2020-04-10: 10 mg via INTRAVENOUS
  Administered 2020-04-10: 70 mg via INTRAVENOUS

## 2020-04-10 MED ORDER — ONDANSETRON HCL 4 MG/2ML IJ SOLN
INTRAMUSCULAR | Status: AC
  Filled 2020-04-10: qty 2

## 2020-04-10 MED ORDER — ONDANSETRON HCL 4 MG/2ML IJ SOLN
INTRAMUSCULAR | Status: DC | PRN
  Administered 2020-04-10: 4 mg via INTRAVENOUS

## 2020-04-10 MED ORDER — SODIUM CHLORIDE 0.9 % IV SOLN
2.0000 g | INTRAVENOUS | Status: AC
  Administered 2020-04-10: 2 g via INTRAVENOUS
  Filled 2020-04-10: qty 2

## 2020-04-10 MED ORDER — MIDAZOLAM HCL 2 MG/2ML IJ SOLN
INTRAMUSCULAR | Status: AC
  Filled 2020-04-10: qty 2

## 2020-04-10 MED ORDER — LIDOCAINE 2% (20 MG/ML) 5 ML SYRINGE
INTRAMUSCULAR | Status: DC | PRN
  Administered 2020-04-10: 60 mg via INTRAVENOUS

## 2020-04-10 MED ORDER — PHENYLEPHRINE 40 MCG/ML (10ML) SYRINGE FOR IV PUSH (FOR BLOOD PRESSURE SUPPORT)
PREFILLED_SYRINGE | INTRAVENOUS | Status: DC | PRN
  Administered 2020-04-10: 80 ug via INTRAVENOUS

## 2020-04-10 MED ORDER — PANTOPRAZOLE SODIUM 40 MG IV SOLR
40.0000 mg | Freq: Every day | INTRAVENOUS | Status: DC
  Administered 2020-04-10: 40 mg via INTRAVENOUS
  Filled 2020-04-10: qty 40

## 2020-04-10 MED ORDER — LACTATED RINGERS IV SOLN: INTRAVENOUS | Status: DC

## 2020-04-10 MED ORDER — SODIUM CHLORIDE 0.9 % IV SOLN: INTRAVENOUS | Status: DC

## 2020-04-10 MED ORDER — LIDOCAINE 20MG/ML (2%) 15 ML SYRINGE OPTIME
INTRAMUSCULAR | Status: DC | PRN
  Administered 2020-04-10: 1.5 mg/kg/h via INTRAVENOUS

## 2020-04-10 MED ORDER — DEXAMETHASONE SODIUM PHOSPHATE 10 MG/ML IJ SOLN
INTRAMUSCULAR | Status: AC
  Filled 2020-04-10: qty 1

## 2020-04-10 MED ORDER — DEXAMETHASONE SODIUM PHOSPHATE 10 MG/ML IJ SOLN
INTRAMUSCULAR | Status: DC | PRN
  Administered 2020-04-10: 4 mg via INTRAVENOUS

## 2020-04-10 MED ORDER — KETAMINE HCL 10 MG/ML IJ SOLN
INTRAMUSCULAR | Status: DC | PRN
  Administered 2020-04-10: 25 mg via INTRAVENOUS

## 2020-04-10 MED ORDER — FENTANYL CITRATE (PF) 250 MCG/5ML IJ SOLN
INTRAMUSCULAR | Status: AC
  Filled 2020-04-10: qty 5

## 2020-04-10 MED ORDER — FLUOXETINE HCL 20 MG PO CAPS
40.0000 mg | ORAL_CAPSULE | Freq: Every day | ORAL | Status: DC
  Administered 2020-04-11: 40 mg via ORAL
  Filled 2020-04-10: qty 2

## 2020-04-10 MED ORDER — MIDAZOLAM HCL 2 MG/2ML IJ SOLN
INTRAMUSCULAR | Status: DC | PRN
  Administered 2020-04-10: 2 mg via INTRAVENOUS

## 2020-04-10 MED ORDER — STERILE WATER FOR IRRIGATION IR SOLN
Status: DC | PRN
  Administered 2020-04-10: 1000 mL

## 2020-04-10 MED ORDER — PHENYLEPHRINE 40 MCG/ML (10ML) SYRINGE FOR IV PUSH (FOR BLOOD PRESSURE SUPPORT)
PREFILLED_SYRINGE | INTRAVENOUS | Status: AC
  Filled 2020-04-10: qty 10

## 2020-04-10 MED ORDER — ACETAMINOPHEN 500 MG PO TABS
1000.0000 mg | ORAL_TABLET | Freq: Three times a day (TID) | ORAL | Status: DC
  Administered 2020-04-11: 05:00:00 1000 mg via ORAL
  Filled 2020-04-10: qty 2

## 2020-04-10 MED ORDER — PROMETHAZINE HCL 25 MG/ML IJ SOLN
INTRAMUSCULAR | Status: AC
  Administered 2020-04-10: 6.25 mg via INTRAVENOUS
  Filled 2020-04-10: qty 1

## 2020-04-10 MED ORDER — LIDOCAINE HCL 2 % IJ SOLN
INTRAMUSCULAR | Status: AC
  Filled 2020-04-10: qty 20

## 2020-04-10 MED ORDER — BUPIVACAINE-EPINEPHRINE (PF) 0.25% -1:200000 IJ SOLN
INTRAMUSCULAR | Status: AC
  Filled 2020-04-10: qty 30

## 2020-04-10 MED ORDER — PROPOFOL 10 MG/ML IV BOLUS
INTRAVENOUS | Status: DC | PRN
  Administered 2020-04-10: 200 mg via INTRAVENOUS

## 2020-04-10 MED ORDER — BUPIVACAINE-EPINEPHRINE 0.25% -1:200000 IJ SOLN
INTRAMUSCULAR | Status: DC | PRN
  Administered 2020-04-10: 30 mL

## 2020-04-10 MED ORDER — ACETAMINOPHEN 160 MG/5ML PO SOLN
1000.0000 mg | Freq: Three times a day (TID) | ORAL | Status: DC
  Administered 2020-04-10: 21:00:00 1000 mg via ORAL
  Filled 2020-04-10: qty 40.6

## 2020-04-10 MED ORDER — LIDOCAINE HCL (PF) 2 % IJ SOLN
INTRAMUSCULAR | Status: AC
  Filled 2020-04-10: qty 5

## 2020-04-10 MED ORDER — APREPITANT 40 MG PO CAPS
40.0000 mg | ORAL_CAPSULE | ORAL | Status: AC
  Administered 2020-04-10: 40 mg via ORAL
  Filled 2020-04-10: qty 1

## 2020-04-10 MED ORDER — TRAMADOL HCL 50 MG PO TABS
50.0000 mg | ORAL_TABLET | Freq: Four times a day (QID) | ORAL | Status: DC | PRN
  Administered 2020-04-10: 50 mg via ORAL
  Filled 2020-04-10: qty 1

## 2020-04-10 MED ORDER — PROMETHAZINE HCL 25 MG/ML IJ SOLN
6.2500 mg | INTRAMUSCULAR | Status: AC | PRN
  Administered 2020-04-10: 6.25 mg via INTRAVENOUS

## 2020-04-10 MED ORDER — BUPIVACAINE LIPOSOME 1.3 % IJ SUSP
INTRAMUSCULAR | Status: DC | PRN
  Administered 2020-04-10: 20 mL

## 2020-04-10 MED ORDER — CHLORHEXIDINE GLUCONATE 0.12 % MT SOLN
15.0000 mL | Freq: Once | OROMUCOSAL | Status: AC
  Administered 2020-04-10: 15 mL via OROMUCOSAL

## 2020-04-10 MED ORDER — HYDROMORPHONE HCL 1 MG/ML IJ SOLN
INTRAMUSCULAR | Status: AC
  Filled 2020-04-10: qty 1

## 2020-04-10 MED ORDER — SCOPOLAMINE 1 MG/3DAYS TD PT72
1.0000 | MEDICATED_PATCH | TRANSDERMAL | Status: DC
  Administered 2020-04-10: 1.5 mg via TRANSDERMAL
  Filled 2020-04-10: qty 1

## 2020-04-10 MED ORDER — KETAMINE HCL 10 MG/ML IJ SOLN
INTRAMUSCULAR | Status: AC
  Filled 2020-04-10: qty 1

## 2020-04-10 MED ORDER — TRAMADOL HCL 50 MG PO TABS
ORAL_TABLET | ORAL | Status: AC
  Filled 2020-04-10: qty 1

## 2020-04-10 MED ORDER — HYDROMORPHONE HCL 1 MG/ML IJ SOLN
0.2500 mg | INTRAMUSCULAR | Status: DC | PRN
  Administered 2020-04-10: 0.5 mg via INTRAVENOUS

## 2020-04-10 MED ORDER — SIMETHICONE 80 MG PO CHEW
80.0000 mg | CHEWABLE_TABLET | Freq: Four times a day (QID) | ORAL | Status: DC | PRN
  Administered 2020-04-11 (×3): 80 mg via ORAL
  Filled 2020-04-10 (×3): qty 1

## 2020-04-10 MED ORDER — 0.9 % SODIUM CHLORIDE (POUR BTL) OPTIME
TOPICAL | Status: DC | PRN
  Administered 2020-04-10: 1000 mL

## 2020-04-10 MED ORDER — ENOXAPARIN SODIUM 30 MG/0.3ML ~~LOC~~ SOLN
30.0000 mg | Freq: Two times a day (BID) | SUBCUTANEOUS | Status: DC
  Administered 2020-04-10 – 2020-04-11 (×2): 30 mg via SUBCUTANEOUS
  Filled 2020-04-10 (×2): qty 0.3

## 2020-04-10 MED ORDER — KETOROLAC TROMETHAMINE 30 MG/ML IJ SOLN: 30.0000 mg | Freq: Once | INTRAMUSCULAR | Status: DC | PRN

## 2020-04-10 MED ORDER — ENSURE MAX PROTEIN PO LIQD
2.0000 [oz_av] | ORAL | Status: DC
  Administered 2020-04-11 (×4): 2 [oz_av] via ORAL

## 2020-04-10 MED ORDER — GABAPENTIN 100 MG PO CAPS
200.0000 mg | ORAL_CAPSULE | Freq: Two times a day (BID) | ORAL | Status: DC
  Administered 2020-04-10 – 2020-04-11 (×2): 200 mg via ORAL
  Filled 2020-04-10 (×2): qty 2

## 2020-04-10 MED ORDER — ORAL CARE MOUTH RINSE: 15.0000 mL | Freq: Once | OROMUCOSAL | Status: AC

## 2020-04-10 MED ORDER — SUGAMMADEX SODIUM 500 MG/5ML IV SOLN
INTRAVENOUS | Status: AC
  Filled 2020-04-10: qty 5

## 2020-04-10 MED ORDER — METOPROLOL TARTRATE 5 MG/5ML IV SOLN
5.0000 mg | Freq: Four times a day (QID) | INTRAVENOUS | Status: DC | PRN
Start: 2020-04-10 — End: 2020-04-11

## 2020-04-10 SURGICAL SUPPLY — 72 items
APL PRP STRL LF DISP 70% ISPRP (MISCELLANEOUS) ×2
APL SKNCLS STERI-STRIP NONHPOA (GAUZE/BANDAGES/DRESSINGS) ×1
APPLIER CLIP ROT 10 11.4 M/L (STAPLE)
APPLIER CLIP ROT 13.4 12 LRG (CLIP) ×2
APR CLP LRG 13.4X12 ROT 20 MLT (CLIP) ×1
APR CLP MED LRG 11.4X10 (STAPLE)
BAG LAPAROSCOPIC 12 15 PORT 16 (BASKET) IMPLANT
BAG RETRIEVAL 12/15 (BASKET)
BENZOIN TINCTURE PRP APPL 2/3 (GAUZE/BANDAGES/DRESSINGS) ×2 IMPLANT
BLADE SURG SZ11 CARB STEEL (BLADE) ×2 IMPLANT
BNDG ADH 1X3 SHEER STRL LF (GAUZE/BANDAGES/DRESSINGS) ×12 IMPLANT
BNDG ADH THN 3X1 STRL LF (GAUZE/BANDAGES/DRESSINGS) ×6
CABLE HIGH FREQUENCY MONO STRZ (ELECTRODE) ×2 IMPLANT
CHLORAPREP W/TINT 26 (MISCELLANEOUS) ×4 IMPLANT
CLIP APPLIE ROT 10 11.4 M/L (STAPLE) IMPLANT
CLIP APPLIE ROT 13.4 12 LRG (CLIP) IMPLANT
COVER SURGICAL LIGHT HANDLE (MISCELLANEOUS) ×2 IMPLANT
COVER WAND RF STERILE (DRAPES) IMPLANT
DECANTER SPIKE VIAL GLASS SM (MISCELLANEOUS) ×2 IMPLANT
DEVICE SUT QUICK LOAD TK 5 (STAPLE) ×1 IMPLANT
DEVICE SUT TI-KNOT TK 5X26 (MISCELLANEOUS) ×1 IMPLANT
DRAPE UTILITY XL STRL (DRAPES) ×4 IMPLANT
ELECT REM PT RETURN 15FT ADLT (MISCELLANEOUS) ×2 IMPLANT
GAUZE SPONGE 4X4 12PLY STRL (GAUZE/BANDAGES/DRESSINGS) IMPLANT
GLOVE SURG ENC MOIS LTX SZ6 (GLOVE) ×2 IMPLANT
GLOVE SURG UNDER LTX SZ6.5 (GLOVE) ×2 IMPLANT
GOWN STRL REUS W/TWL LRG LVL3 (GOWN DISPOSABLE) ×2 IMPLANT
GOWN STRL REUS W/TWL XL LVL3 (GOWN DISPOSABLE) ×4 IMPLANT
GRASPER SUT TROCAR 14GX15 (MISCELLANEOUS) ×2 IMPLANT
KIT BASIN OR (CUSTOM PROCEDURE TRAY) ×2 IMPLANT
KIT TURNOVER KIT A (KITS) IMPLANT
MARKER SKIN DUAL TIP RULER LAB (MISCELLANEOUS) ×2 IMPLANT
MAT PREVALON FULL STRYKER (MISCELLANEOUS) ×2 IMPLANT
NDL SPNL 22GX3.5 QUINCKE BK (NEEDLE) ×1 IMPLANT
NEEDLE SPNL 22GX3.5 QUINCKE BK (NEEDLE) ×2 IMPLANT
PACK UNIVERSAL I (CUSTOM PROCEDURE TRAY) ×2 IMPLANT
RELOAD ENDO STITCH (ENDOMECHANICALS) ×2 IMPLANT
RELOAD STAPLE 60 3.6 BLU REG (STAPLE) ×1 IMPLANT
RELOAD STAPLE 60 3.8 GOLD REG (STAPLE) ×1 IMPLANT
RELOAD STAPLE 60 4.1 GRN THCK (STAPLE) IMPLANT
RELOAD STAPLER BLUE 60MM (STAPLE) ×1 IMPLANT
RELOAD STAPLER GOLD 60MM (STAPLE) ×1 IMPLANT
RELOAD STAPLER GREEN 60MM (STAPLE) IMPLANT
RELOAD SUT TRIPLE-STITCH 2-0 (ENDOMECHANICALS) IMPLANT
SCISSORS LAP 5X45 EPIX DISP (ENDOMECHANICALS) ×2 IMPLANT
SET IRRIG TUBING LAPAROSCOPIC (IRRIGATION / IRRIGATOR) ×2 IMPLANT
SET TUBE SMOKE EVAC HIGH FLOW (TUBING) ×2 IMPLANT
SHEARS HARMONIC ACE PLUS 45CM (MISCELLANEOUS) ×2 IMPLANT
SLEEVE ADV FIXATION 5X100MM (TROCAR) ×4 IMPLANT
SLEEVE GASTRECTOMY 40FR VISIGI (MISCELLANEOUS) ×2 IMPLANT
SOL ANTI FOG 6CC (MISCELLANEOUS) ×1 IMPLANT
SOLUTION ANTI FOG 6CC (MISCELLANEOUS) ×1
SPONGE LAP 18X18 RF (DISPOSABLE) ×2 IMPLANT
STAPLER ECHELON BIOABSB 60 FLE (MISCELLANEOUS) IMPLANT
STAPLER ECHELON LONG 60 440 (INSTRUMENTS) ×2 IMPLANT
STAPLER RELOAD BLUE 60MM (STAPLE) ×2
STAPLER RELOAD GOLD 60MM (STAPLE) ×2
STAPLER RELOAD GREEN 60MM (STAPLE)
STRIP CLOSURE SKIN 1/2X4 (GAUZE/BANDAGES/DRESSINGS) ×2 IMPLANT
SUT MNCRL AB 4-0 PS2 18 (SUTURE) ×2 IMPLANT
SUT SURGIDAC NAB ES-9 0 48 120 (SUTURE) ×1 IMPLANT
SUT VICRYL 0 TIES 12 18 (SUTURE) ×2 IMPLANT
SYR 10ML ECCENTRIC (SYRINGE) ×2 IMPLANT
SYR 20ML LL LF (SYRINGE) ×2 IMPLANT
SYR 50ML LL SCALE MARK (SYRINGE) ×2 IMPLANT
TOWEL OR 17X26 10 PK STRL BLUE (TOWEL DISPOSABLE) ×2 IMPLANT
TOWEL OR NON WOVEN STRL DISP B (DISPOSABLE) ×2 IMPLANT
TROCAR ADV FIXATION 5X100MM (TROCAR) ×2 IMPLANT
TROCAR BLADELESS 15MM (ENDOMECHANICALS) ×2 IMPLANT
TROCAR BLADELESS OPT 5 100 (ENDOMECHANICALS) ×2 IMPLANT
TUBING CONNECTING 10 (TUBING) ×2 IMPLANT
TUBING ENDO SMARTCAP (MISCELLANEOUS) ×2 IMPLANT

## 2020-04-10 NOTE — Op Note (Signed)
Operative Note  [name]  [id number]  [id number]  04/10/2020   Surgeon: Berna Bue MD   Assistant: Gaynelle Adu MD   Procedure performed: laparoscopic sleeve gastrectomy, hiatal hernia repair, upper endoscopy   Preop diagnosis: Morbid obesity Body mass index is 47.59 kg/m. Post-op diagnosis/intraop findings: same   Specimens: fundus Retained items: none  EBL: minimal  Complications: none   Description of procedure: After obtaining informed consent and administration of chemical DVT prophylaxis in holding, the patient was taken to the operating room and placed supine on operating room table where general endotracheal anesthesia was initiated, preoperative antibiotics were administered, SCDs applied, and a formal timeout was performed. The abdomen was prepped and draped in usual sterile fashion. Peritoneal access was gained using a Visiport technique in the left upper quadrant and insufflation to 15 mmHg ensued without issue. Gross inspection revealed no evidence of injury. Under direct visualization three more 5 mm trochars were placed in the right and left hemiabdomen and the 70mm trocar in the right paramedian upper abdomen. Bilateral laparoscopic assisted TAPS blocks were performed with Exparel diluted with 0.25 percent Marcaine with epinephrine. The patient was placed in steep Trendelenburg and the liver retractor was introduced through an incision in the upper midline and secured to the post externally to maintain the left lobe retracted anteriorly.  The calibration tubing was passed down by the anesthesiologist and the balloon inflated to 10 mL, this was able to easily pass through the hiatus confirming the upper GI findings of small hiatal hernia. The pars lucida was entered with Harmonic scalpel and the posterior aspect of the right and left crus were dissected out using Harmonic and blunt dissection. The hiatus was narrowed with a single suture of 0 Ethibond secured with  the ty-knot device.  Using the Harmonic scalpel, the greater curvature of the stomach was dissected away from the greater omentum and short gastric vessels were divided. This began 6 cm from the pylorus, and dissection proceeded until the left crus was clearly exposed. The 70 [place] VisiGi was then introduced and directed down towards the pylorus. This was placed to suction against the lesser curve. Serial fires of the linear cutting stapler were then employed to create our sleeve. The first fire used a green load and ensured adequate room at the angularis incisura. One gold load and then several blue loads were then employed to create a narrow tubular stomach up to the angle of His. The excised stomach was then removed through our 15 mm trocar site within an Endo Catch bag.  The visigi was taken off of suction and a few puffs of air were introduced, inflating the sleeve. No bubbles were observed in the irrigation fluid around the stomach and the shape was noted to be evenly tubular without any narrowing at the angularis. The visigi was then removed. Several small oozing points on the staple line were addressed with clips. Upper endoscopy was performed by the assistant surgeon and the sleeve was noted to be airtight, the staple line was hemostatic. Please see his separate note. The endoscope was removed. The 15 mm trocar site fascia in the right upper abdomen was closed with a 0 Vicryl using the laparoscopic suture passer under direct visualization. The liver retractor was removed under direct visualization. The abdomen was then desufflated and all remaining trochars removed. The skin incisions were closed with subcuticular Monocryl; benzoin, Steri-Strips and Band-Aids were applied The patient was then awakened, extubated and taken to PACU in stable  condition.     All counts were correct at the completion of the case.

## 2020-04-10 NOTE — Progress Notes (Signed)
PHARMACY CONSULT FOR:  Risk Assessment for Post-Discharge VTE Following Bariatric Surgery  Post-Discharge VTE Risk Assessment: This patient's probability of 30-day post-discharge VTE is increased due to the factors marked:   Female    Age >/=60 years    BMI >/=50 kg/m2    CHF    Dyspnea at Rest    Paraplegia  X  Non-gastric-band surgery    Operation Time >/=3 hr    Return to OR     Length of Stay >/= 3 d   Hx of VTE   Hypercoagulable condition   Significant venous stasis       Predicted probability of 30-day post-discharge VTE: 0.16%  Other patient-specific factors to consider:  Recommendation for Discharge: No pharmacologic prophylaxis post-discharge  [name] is a 40 y.o. female who underwent laparoscopic gastric sleeve resection on 04/10/2020.   Case start: 1111 Case end: 1220   No Known Allergies  Patient Measurements: Height: 5\' 5"  (165.1 cm) Weight: 129.7 kg (286 lb) IBW/kg (Calculated) : 57 Body mass index is 47.59 kg/m.  No results for input(s): WBC, HGB, HCT, PLT, APTT, CREATININE, LABCREA, CREATININE, CREAT24HRUR, MG, PHOS, ALBUMIN, PROT, ALBUMIN, AST, ALT, ALKPHOS, BILITOT, BILIDIR, IBILI in the last 72 hours. Estimated Creatinine Clearance: 127.1 mL/min (by C-G formula based on SCr of 0.8 mg/dL).    Past Medical History:  Diagnosis Date  . Anxiety   . Depression   . GERD (gastroesophageal reflux disease)   . History of hiatal hernia   . History of kidney stones   . Kidney stones   . PONV (postoperative nausea and vomiting)      Medications Prior to Admission  Medication Sig Dispense Refill Last Dose  . CRANBERRY PO Take 250 mg by mouth daily.   04/09/2020 at Unknown time  . FLUoxetine (PROZAC) 40 MG capsule Take 40 mg by mouth daily.   04/10/2020 at Unknown time  . pantoprazole (PROTONIX) 40 MG tablet Take 40 mg by mouth daily.   04/10/2020 at Unknown time       06/08/2020 04/10/2020,2:05 PM

## 2020-04-10 NOTE — Transfer of Care (Signed)
Immediate Anesthesia Transfer of Care Note  Patient: [name]  Procedure(s) Performed: LAPAROSCOPIC GASTRIC SLEEVE RESECTION (N/A Abdomen) UPPER GI ENDOSCOPY (N/A Abdomen)  Patient Location: PACU  Anesthesia Type:General  Level of Consciousness: awake, drowsy and responds to stimulation  Airway & Oxygen Therapy: Patient Spontanous Breathing and Patient connected to face mask oxygen  Post-op Assessment: Report given to RN and Post -op Vital signs reviewed and stable  Post vital signs: Reviewed and stable  Last Vitals:  Vitals Value Taken Time  BP 159/79 04/10/20 1228  Temp 36.6 C 04/10/20 1228  Pulse 79 04/10/20 1230  Resp 15 04/10/20 1230  SpO2 94 % 04/10/20 1230  Vitals shown include unvalidated device data.  Last Pain:  Vitals:   04/10/20 0924  TempSrc: Oral  PainSc: 0-No pain         Complications: No complications documented.

## 2020-04-10 NOTE — Interval H&P Note (Signed)
History and Physical Interval Note:  04/10/2020 10:08 AM  [name]  has presented today for surgery, with the diagnosis of morbid obesity.  The various methods of treatment have been discussed with the patient and family. After consideration of risks, benefits and other options for treatment, the patient has consented to  Procedure(s): LAPAROSCOPIC GASTRIC SLEEVE RESECTION (N/A) UPPER GI ENDOSCOPY (N/A) as a surgical intervention.  The patient's history has been reviewed, patient examined, no change in status, stable for surgery.  I have reviewed the patient's chart and labs.  Questions were answered to the patient's satisfaction.     Devynn Hessler Lollie Sails

## 2020-04-10 NOTE — Anesthesia Postprocedure Evaluation (Signed)
Anesthesia Post Note  Patient: [name]  Procedure(s) Performed: LAPAROSCOPIC GASTRIC SLEEVE RESECTION (N/A Abdomen) UPPER GI ENDOSCOPY (N/A Abdomen)     Patient location during evaluation: PACU Anesthesia Type: General Level of consciousness: awake and alert Pain management: pain level controlled Vital Signs Assessment: post-procedure vital signs reviewed and stable Respiratory status: spontaneous breathing, nonlabored ventilation, respiratory function stable and patient connected to nasal cannula oxygen Cardiovascular status: blood pressure returned to baseline and stable Postop Assessment: no apparent nausea or vomiting Anesthetic complications: no   No complications documented.  Last Vitals:  Vitals:   04/10/20 1315 04/10/20 1330  BP: (!) 169/85 (!) 175/90  Pulse: 69 72  Resp: 19 18  Temp:  (!) (P) 36.4 C  SpO2: 96% 94%    Last Pain:  Vitals:   04/10/20 1330  TempSrc:   PainSc: 0-No pain                 Kerith Sherley S

## 2020-04-10 NOTE — Anesthesia Preprocedure Evaluation (Signed)
Anesthesia Evaluation  Patient identified by MRN, date of birth, ID band Patient awake    Reviewed: Allergy & Precautions, H&P , NPO status , Patient's Chart, lab work & pertinent test results  History of Anesthesia Complications (+) PONV  Airway Mallampati: II  TM Distance: >3 FB Neck ROM: Full    Dental no notable dental hx.    Pulmonary neg pulmonary ROS, former smoker,    Pulmonary exam normal breath sounds clear to auscultation       Cardiovascular negative cardio ROS Normal cardiovascular exam Rhythm:Regular Rate:Normal     Neuro/Psych negative neurological ROS  negative psych ROS   GI/Hepatic Neg liver ROS, GERD  ,  Endo/Other  Morbid obesity  Renal/GU negative Renal ROS  negative genitourinary   Musculoskeletal negative musculoskeletal ROS (+)   Abdominal (+) + obese,   Peds negative pediatric ROS (+)  Hematology negative hematology ROS (+)   Anesthesia Other Findings   Reproductive/Obstetrics negative OB ROS                             Anesthesia Physical Anesthesia Plan  ASA: II  Anesthesia Plan: General   Post-op Pain Management:    Induction: Intravenous  PONV Risk Score and Plan: 4 or greater and Ondansetron, Aprepitant, Dexamethasone, Midazolam, Scopolamine patch - Pre-op and Treatment may vary due to age or medical condition  Airway Management Planned: Oral ETT  Additional Equipment:   Intra-op Plan:   Post-operative Plan: Extubation in OR  Informed Consent: I have reviewed the patients History and Physical, chart, labs and discussed the procedure including the risks, benefits and alternatives for the proposed anesthesia with the patient or authorized representative who has indicated his/her understanding and acceptance.     Dental advisory given  Plan Discussed with: CRNA and Surgeon  Anesthesia Plan Comments:         Anesthesia Quick  Evaluation

## 2020-04-10 NOTE — Progress Notes (Signed)
Discussed post op day goals with patient including ambulation, IS, diet progression, pain, and nausea control.  BSTOP education provided including BSTOP information guide, "Guide for Pain Management after your Bariatric Procedure".  Questions answered. 

## 2020-04-10 NOTE — Op Note (Signed)
RONNIKA COLLETT [id number] 31-Mar-1980 04/10/2020  Preoperative diagnosis: severe obesity  Postoperative diagnosis: Same   Procedure: upper endoscopy   Surgeon: Mary Sella. Wilson M.D., FACS   Anesthesia: Gen.   Indications for procedure: 40 y.o. year old female undergoing Laparoscopic Gastric Sleeve Resection and an EGD was requested to evaluate the new gastric sleeve.   Description of procedure: After we have completed the sleeve resection, I scrubbed out and obtained the Olympus endoscope. I gently placed endoscope in the patient's oropharynx and gently glided it down the esophagus without any difficulty under direct visualization. Once I was in the gastric sleeve, I insufflated the stomach with air. I was able to cannulate and advanced the scope through the gastric sleeve. I was able to cannulate the duodenum with ease. Dr. Fredricka Bonine had placed saline in the upper abdomen. Upon further insufflation of the gastric sleeve there was no evidence of bubbles. GE junction located at 36 cm.  Upon further inspection of the gastric sleeve, the mucosa appeared normal. There is no evidence of any mucosal abnormality. The sleeve was widely patent at the angularis. There was no evidence of bleeding. The gastric sleeve was decompressed. The scope was withdrawn. The patient tolerated this portion of the procedure well. Please see Dr Derrill Memo operative note for details regarding the laparoscopic gastric sleeve resection.   Mary Sella. Andrey Campanile, MD, FACS  General, Bariatric, & Minimally Invasive Surgery  [facility], [place]

## 2020-04-10 NOTE — Anesthesia Procedure Notes (Signed)
Procedure Name: Intubation Date/Time: 04/10/2020 10:54 AM Performed by: Elyn Peers, CRNA Pre-anesthesia Checklist: Patient identified, Emergency Drugs available, Suction available, Patient being monitored and Timeout performed Patient Re-evaluated:Patient Re-evaluated prior to induction Oxygen Delivery Method: Circle system utilized Preoxygenation: Pre-oxygenation with 100% oxygen Induction Type: IV induction Ventilation: Mask ventilation without difficulty and Oral airway inserted - appropriate to patient size Laryngoscope Size: Miller and 3 Grade View: Grade II Tube type: Oral Tube size: 7.0 mm Number of attempts: 1 Airway Equipment and Method: Stylet Placement Confirmation: ETT inserted through vocal cords under direct vision,  positive ETCO2 and breath sounds checked- equal and bilateral Secured at: 22 cm Tube secured with: Tape Dental Injury: Teeth and Oropharynx as per pre-operative assessment

## 2020-04-11 ENCOUNTER — Other Ambulatory Visit (HOSPITAL_COMMUNITY): Payer: Self-pay | Admitting: Surgery

## 2020-04-11 ENCOUNTER — Encounter (HOSPITAL_COMMUNITY): Payer: Self-pay | Admitting: Surgery

## 2020-04-11 LAB — COMPREHENSIVE METABOLIC PANEL
ALT: 38 U/L (ref 0–44)
AST: 33 U/L (ref 15–41)
Albumin: 3.4 g/dL — ABNORMAL LOW (ref 3.5–5.0)
Alkaline Phosphatase: 49 U/L (ref 38–126)
Anion gap: 10 (ref 5–15)
BUN: 8 mg/dL (ref 6–20)
CO2: 23 mmol/L (ref 22–32)
Calcium: 8.3 mg/dL — ABNORMAL LOW (ref 8.9–10.3)
Chloride: 104 mmol/L (ref 98–111)
Creatinine, Ser: 0.68 mg/dL (ref 0.44–1.00)
GFR, Estimated: >60 mL/min (ref >60)
Glucose, Bld: 101 mg/dL — ABNORMAL HIGH (ref 70–99)
Potassium: 4.2 mmol/L (ref 3.5–5.1)
Sodium: 137 mmol/L (ref 135–145)
Total Bilirubin: 0.7 mg/dL (ref 0.3–1.2)
Total Protein: 6.6 g/dL (ref 6.5–8.1)

## 2020-04-11 LAB — CBC WITH DIFFERENTIAL/PLATELET
Abs Immature Granulocytes: 0.06 10*3/uL (ref 0.00–0.07)
Basophils Absolute: 0 10*3/uL (ref 0.0–0.1)
Basophils Relative: 0 %
Eosinophils Absolute: 0 10*3/uL (ref 0.0–0.5)
Eosinophils Relative: 0 %
HCT: 35.3 % — ABNORMAL LOW (ref 36.0–46.0)
Hemoglobin: 10.6 g/dL — ABNORMAL LOW (ref 12.0–15.0)
Immature Granulocytes: 0 %
Lymphocytes Relative: 9 %
Lymphs Abs: 1.3 10*3/uL (ref 0.7–4.0)
MCH: 24.7 pg — ABNORMAL LOW (ref 26.0–34.0)
MCHC: 30 g/dL (ref 30.0–36.0)
MCV: 82.3 fL (ref 80.0–100.0)
Monocytes Absolute: 0.9 10*3/uL (ref 0.1–1.0)
Monocytes Relative: 6 %
Neutro Abs: 12.6 10*3/uL — ABNORMAL HIGH (ref 1.7–7.7)
Neutrophils Relative %: 85 %
Platelets: 286 10*3/uL (ref 150–400)
RBC: 4.29 MIL/uL (ref 3.87–5.11)
RDW: 15.1 % (ref 11.5–15.5)
WBC: 14.9 10*3/uL — ABNORMAL HIGH (ref 4.0–10.5)
nRBC: 0 % (ref 0.0–0.2)

## 2020-04-11 LAB — MAGNESIUM: Magnesium: 1.9 mg/dL (ref 1.7–2.4)

## 2020-04-11 LAB — SURGICAL PATHOLOGY

## 2020-04-11 MED ORDER — PANTOPRAZOLE SODIUM 40 MG PO TBEC: 40.0000 mg | DELAYED_RELEASE_TABLET | Freq: Every day | ORAL | 0 refills | Status: DC

## 2020-04-11 MED ORDER — ACETAMINOPHEN 500 MG PO TABS: 1000.0000 mg | ORAL_TABLET | Freq: Three times a day (TID) | ORAL | 0 refills | Status: AC

## 2020-04-11 MED ORDER — TRAMADOL HCL 50 MG PO TABS: 50.0000 mg | ORAL_TABLET | Freq: Four times a day (QID) | ORAL | 0 refills | Status: DC | PRN

## 2020-04-11 MED ORDER — ONDANSETRON 4 MG PO TBDP: 4.0000 mg | ORAL_TABLET | Freq: Four times a day (QID) | ORAL | 0 refills | Status: DC | PRN

## 2020-04-11 MED ORDER — DOCUSATE SODIUM 100 MG PO CAPS: 100.0000 mg | ORAL_CAPSULE | Freq: Two times a day (BID) | ORAL | 0 refills | Status: DC

## 2020-04-11 MED ORDER — GABAPENTIN 100 MG PO CAPS: 200.0000 mg | ORAL_CAPSULE | Freq: Two times a day (BID) | ORAL | 0 refills | Status: DC

## 2020-04-11 NOTE — Progress Notes (Signed)
Discharge instructions given to patient and all questions were answered.  

## 2020-04-11 NOTE — Discharge Instructions (Signed)
° ° ° °GASTRIC BYPASS/SLEEVE ° Home Care Instructions ° ° These instructions are to help you care for yourself when you go home. ° °Call: If you have any problems. °• Call [phone] and ask for the surgeon on call °• If you need immediate help, come to the ER at [place].  °• Tell the ER staff that you are a new post-op gastric bypass or gastric sleeve patient °  °Signs and symptoms to report: • Severe vomiting or nausea °o If you cannot keep down clear liquids for longer than 1 day, call your surgeon  °• Abdominal pain that does not get better after taking your pain medication °• Fever over 100.4° F with chills °• Heart beating over 100 beats a minute °• Shortness of breath at rest °• Chest pain °•  Redness, swelling, drainage, or foul odor at incision (surgical) sites °•  If your incisions open or pull apart °• Swelling or pain in calf (lower leg) °• Diarrhea (Loose bowel movements that happen often), frequent watery, uncontrolled bowel movements °• Constipation, (no bowel movements for 3 days) if this happens: Pick one °o Milk of Magnesia, 2 tablespoons by mouth, 3 times a day for 2 days if needed °o Stop taking Milk of Magnesia once you have a bowel movement °o Call your doctor if constipation continues °Or °o Miralax  (instead of Milk of Magnesia) following the label instructions °o Stop taking Miralax once you have a bowel movement °o Call your doctor if constipation continues °• Anything you think is not normal °  °Normal side effects after surgery: • Unable to sleep at night or unable to focus °• Irritability or moody °• Being tearful (crying) or depressed °These are common complaints, possibly related to your anesthesia medications that put you to sleep, stress of surgery, and change in lifestyle.  This usually goes away a few weeks after surgery.  If these feelings continue, call your primary care doctor. °  °Wound Care: You may have surgical glue, steri-strips, or staples over your incisions after  surgery °• Surgical glue:  Looks like a clear film over your incisions and will wear off a little at a time °• Steri-strips: Strips of tape over your incisions. You may notice a yellowish color on the skin under the steri-strips. This is used to make the   steri-strips stick better. Do not pull the steri-strips off - let them fall off °• Staples: Staples may be removed before you leave the hospital °o If you go home with staples, call [facility], [phone] at for an appointment with your surgeon’s nurse to have staples removed 10 days after surgery. °• Showering: You may shower two (2) days after your surgery unless your surgeon tells you differently °o Wash gently around incisions with warm soapy water, rinse well, and gently pat dry  °o No tub baths until staples are removed, steri-strips fall off or glue is gone.  °  °Medications: • Medications should be liquid or crushed if larger than the size of a dime °• Extended release pills (medication that release a little bit at a time through the day) should NOT be crushed or cut. (examples include XL, ER, DR, SR) °• Depending on the size and number of medications you take, you may need to space (take a few throughout the day)/change the time you take your medications so that you do not over-fill your pouch (smaller stomach) °• Make sure you follow-up with your primary care doctor to   make medication changes needed during rapid weight loss and life-style changes °• If you have diabetes, follow up with the doctor that orders your diabetes medication(s) within one week after surgery and check your blood sugar regularly. °• Do not drive while taking prescription pain medication  °• It is ok to take Tylenol by the bottle instructions with your pain medicine or instead of your pain medicine as needed.  DO NOT TAKE NSAIDS (EXAMPLES OF NSAIDS:  IBUPROFREN/ NAPROXEN)  °Diet:                    First 2 Weeks ° You will see the dietician t about two (2) weeks  after your surgery. The dietician will increase the types of foods you can eat if you are handling liquids well: °• If you have severe vomiting or nausea and cannot keep down clear liquids lasting longer than 1 day, call your surgeon @ ([phone]) °Protein Shake °• Drink at least 2 ounces of shake 5-6 times per day °• Each serving of protein shakes (usually 8 - 12 ounces) should have: °o 15 grams of protein  °o And no more than 5 grams of carbohydrate  °• Goal for protein each day: °o Men = 80 grams per day °o Women = 60 grams per day °• Protein powder may be added to fluids such as non-fat milk or Lactaid milk or unsweetened Soy/Almond milk (limit to 35 grams added protein powder per serving) ° °Hydration °• Slowly increase the amount of water and other clear liquids as tolerated (See Acceptable Fluids) °• Slowly increase the amount of protein shake as tolerated  °•  Sip fluids slowly and throughout the day.  Do not use straws. °• May use sugar substitutes in small amounts (no more than 6 - 8 packets per day; i.e. Splenda) ° °Fluid Goal °• The first goal is to drink at least 8 ounces of protein shake/drink per day (or as directed by the nutritionist); some examples of protein shakes are Syntrax Nectar, Adkins Advantage, EAS Edge HP, and Unjury. See handout from pre-op Bariatric Education Class: °o Slowly increase the amount of protein shake you drink as tolerated °o You may find it easier to slowly sip shakes throughout the day °o It is important to get your proteins in first °• Your fluid goal is to drink 64 - 100 ounces of fluid daily °o It may take a few weeks to build up to this °• 32 oz (or more) should be clear liquids  °And  °• 32 oz (or more) should be full liquids (see below for examples) °• Liquids should not contain sugar, caffeine, or carbonation ° °Clear Liquids: °• Water or Sugar-free flavored water (i.e. Fruit H2O, Propel) °• Decaffeinated coffee or tea (sugar-free) °• Crystal Lite, Wyler’s Lite,  Minute Maid Lite °• Sugar-free Jell-O °• Bouillon or broth °• Sugar-free Popsicle:   *Less than 20 calories each; Limit 1 per day ° °Full Liquids: °Protein Shakes/Drinks + 2 choices per day of other full liquids °• Full liquids must be: °o No More Than 15 grams of Carbs per serving  °o No More Than 3 grams of Fat per serving °• Strained low-fat cream soup (except Cream of Potato or Tomato) °• Non-Fat milk °• Fat-free Lactaid Milk °• Unsweetened Soy Or Unsweetened Almond Milk °• Low Sugar yogurt (Dannon Lite & Fit, Greek yogurt; Oikos Triple Zero; Chobani Simply 100; Yoplait 100 calorie Greek - No Fruit on the Bottom) ° °  °Vitamins   and Minerals • Start 1 day after surgery unless otherwise directed by your surgeon °• 2 Chewable Bariatric Specific Multivitamin / Multimineral Supplement with iron (Example: Bariatric Advantage Multi EA) °• Chewable Calcium with Vitamin D-3 °(Example: 3 Chewable Calcium Plus 600 with Vitamin D-3) °o Take 500 mg three (3) times a day for a total of 1500 mg each day °o Do not take all 3 doses of calcium at one time as it may cause constipation, and you can only absorb 500 mg  at a time  °o Do not mix multivitamins containing iron with calcium supplements; take 2 hours apart °• Menstruating women and those with a history of anemia (a blood disease that causes weakness) may need extra iron °o Talk with your doctor to see if you need more iron °• Do not stop taking or change any vitamins or minerals until you talk to your dietitian or surgeon °• Your Dietitian and/or surgeon must approve all vitamin and mineral supplements °  °Activity and Exercise: Limit your physical activity as instructed by your doctor.  It is important to continue walking at home.  During this time, use these guidelines: °• Do not lift anything greater than ten (10) pounds for at least two (2) weeks °• Do not go back to work or drive until your surgeon says you can °• You may have sex when you feel comfortable  °o It is  VERY important for female patients to use a reliable birth control method; fertility often increases after surgery  °o All hormonal birth control will be ineffective for 30 days after surgery due to medications given during surgery a barrier method must be used. °o Do not get pregnant for at least 18 months °• Start exercising as soon as your doctor tells you that you can °o Make sure your doctor approves any physical activity °• Start with a simple walking program °• Walk 5-15 minutes each day, 7 days per week.  °• Slowly increase until you are walking 30-45 minutes per day °Consider joining our BELT program. [phone] or email [email or web address] °  °Special Instructions Things to remember: °• Use your CPAP when sleeping if this applies to you ° °• [facility] has two free Bariatric Surgery Support Groups that meet monthly °o The 3rd Thursday of each month, 6 pm, [facility]  °o The 2nd Friday of each month, 11:45 am in the private dining room in the basement of [facility] °• It is very important to keep all follow up appointments with your surgeon, dietitian, primary care physician, and behavioral health practitioner °• Routine follow up schedule with your surgeon include appointments at 2-3 weeks, 6-8 weeks, 6 months, and 1 year at a minimum.  Your surgeon may request to see you more often.   °o After the first year, please follow up with your bariatric surgeon and dietitian at least once a year in order to maintain best weight loss results °[facility]: [phone] °[facility]: [phone] °Bariatric Nurse Coordinator: [phone] °  °   Reviewed and Endorsed  °by [facility] Patient Education Committee, June, 2016 °Edits Approved: Aug, 2018 ° ° ° °

## 2020-04-11 NOTE — Progress Notes (Signed)
S: No acute events. Notes gas pain in her abdomen, starting to improve. Some nausea. Tolerating liquids without dysphagia, reflux or pain  O: Vitals, labs, intake/output, and orders reviewed at this time. Afebrile, no tachycardia, normo- to hypertensive. Sat 98% room air. UOP 1700, PO 420. CMP unremarkable; WBC 14.9 (9.5 preop), Hgb 10.6 (11.5), Plt 286 (353)   Gen: A&Ox3, no distress  H&N: EOMI, atraumatic, neck supple Chest: unlabored respirations, RRR Abd: soft, nontender, nondistended, incision(s) c/d/i woth steris/bandaids, no cellulitis or hemaroma Ext: warm, no edema Neuro: grossly normal  Lines/tubes/drains: PIV  A/P: POD 1 sleeve gastrectomy, doing well -Continue liquids/ protein shakes -Continue ambulation, SCDs, lovenox, pulmonary toilet  Plan discharge later today.    Phylliss Blakes, MD [facility], [place]

## 2020-04-11 NOTE — Discharge Summary (Signed)
Physician Discharge Summary  [name] TKZ:[id number] DOB: [birth year] DOA: 04/10/2020  PCP: Darreld Mclean, MD  Admit date: 04/10/2020 Discharge date: 04/11/2020  Recommendations for Outpatient Follow-up:    Follow-up Information     Clovis Riley, MD. Go on 05/02/2020.   Specialty: General Surgery Why: at 1:40pm.  Please arrive 15 minutes prior to your appointment.  Thanks. Contact information: [address] [place] [address] [phone]         Surgery, [place]. Go on 05/30/2020.   Specialty: General Surgery Why: at 1:30pm with Dr. Kae Heller.  Please arrive 15 minutes prior to your appointment.  Thanks. Contact information: [place] [address] [phone]                Discharge Diagnoses:  Active Problems:   Morbid obesity ([place])   Surgical Procedure: Laparoscopic Sleeve Gastrectomy, upper endoscopy  Discharge Condition: Good Disposition: Home  Diet recommendation: Postoperative sleeve gastrectomy diet (liquids only)  Filed Weights   04/10/20 0924  Weight: 129.7 kg     Hospital Course:  The patient was admitted for a planned laparoscopic sleeve gastrectomy. Please see operative note. Preoperatively the patient was given 5000 units of subcutaneous heparin for DVT prophylaxis. Postoperative prophylactic Lovenox dosing was started on the evening of postoperative day 0. ERAS protocol was used. On the evening of postoperative day 0, the patient was started on water and ice chips. On postoperative day 1 the patient had no fever or tachycardia and was tolerating water in their diet was gradually advanced throughout the day. The patient was ambulating without difficulty. Their vital signs are stable without fever or tachycardia. Their hemoglobin had remained stable. The patient had received discharge instructions and counseling. They were deemed stable for discharge and had met discharge  criteria   Discharge Instructions    Allergies as of 04/11/2020   No Known Allergies      Medication List     TAKE these medications    acetaminophen 500 MG tablet Commonly known as: TYLENOL Take 2 tablets (1,000 mg total) by mouth every 8 (eight) hours for 5 days.   CRANBERRY PO Take 250 mg by mouth daily.   docusate sodium 100 MG capsule Commonly known as: COLACE Take 1 capsule (100 mg total) by mouth 2 (two) times daily.   FLUoxetine 40 MG capsule Commonly known as: PROZAC Take 40 mg by mouth daily.   gabapentin 100 MG capsule Commonly known as: NEURONTIN Take 2 capsules (200 mg total) by mouth every 12 (twelve) hours.   ondansetron 4 MG disintegrating tablet Commonly known as: ZOFRAN-ODT Take 1 tablet (4 mg total) by mouth every 6 (six) hours as needed for nausea or vomiting.   pantoprazole 40 MG tablet Commonly known as: PROTONIX Take 40 mg by mouth daily. What changed: Another medication with the same name was added. Make sure you understand how and when to take each.   pantoprazole 40 MG tablet Commonly known as: PROTONIX Take 1 tablet (40 mg total) by mouth daily. What changed: You were already taking a medication with the same name, and this prescription was added. Make sure you understand how and when to take each.   traMADol 50 MG tablet Commonly known as: ULTRAM Take 1 tablet (50 mg total) by mouth every 6 (six) hours as needed (pain not relieved by other measures.).        Follow-up Information     Romana Juniper A,  MD. Daphane Shepherd on 05/02/2020.   Specialty: General Surgery Why: at 1:40pm.  Please arrive 15 minutes prior to your appointment.  Thanks. Contact information: [address] [place] [address] [phone]         Surgery, [place]. Go on 05/30/2020.   Specialty: General Surgery Why: at 1:30pm with Dr. Kae Heller.  Please arrive 15 minutes prior to your appointment.  Thanks. Contact information: [place] [address] [phone]                  The results of significant diagnostics from this hospitalization (including imaging, microbiology, ancillary and laboratory) are listed below for reference.    Significant Diagnostic Studies: No results found.  Labs: Basic Metabolic Panel: Recent Labs  Lab 04/11/20 0518  NA 137  K 4.2  CL 104  CO2 23  GLUCOSE 101*  BUN 8  CREATININE 0.68  CALCIUM 8.3*  MG 1.9   Liver Function Tests: Recent Labs  Lab 04/11/20 0518  AST 33  ALT 38  ALKPHOS 49  BILITOT 0.7  PROT 6.6  ALBUMIN 3.4*    CBC: Recent Labs  Lab 04/11/20 0518  WBC 14.9*  NEUTROABS 12.6*  HGB 10.6*  HCT 35.3*  MCV 82.3  PLT 286    CBG: No results for input(s): GLUCAP in the last 168 hours.  Active Problems:   Morbid obesity ([place])    Signed:  [facility], [place] 04/12/2020, 8:31 AM

## 2020-04-11 NOTE — Progress Notes (Addendum)
Patient alert and oriented, pain is controlled. Patient is tolerating fluids, advanced to protein shake today, patient is tolerating well.  Reviewed Gastric sleeve discharge instructions with patient and patient is able to articulate understanding.  Provided information on BELT program, Support Group and [facility] outpatient pharmacy. Medications were sent to [facility] on [facility] in [place] for pt to pick up after discharge.  All questions answered, will continue to monitor.  24hr fluid recall- Per dehydration protocol, will call pt to f/u within one week post op.

## 2020-04-11 NOTE — Progress Notes (Signed)
Nutrition Education Note  Received consult by Bariatric nurse coordinator for diet education for patient s/p bariatric surgery.  RD working remotely, spoke with pt over the phone and reviewed diet and vitamin recommendations.   Discussed 2 week post op diet with pt. Emphasized that liquids must be non carbonated, non caffeinated, and sugar free. Fluid goals discussed. Pt to follow up with outpatient bariatric RD for further diet progression after 2 weeks. Multivitamins and minerals also reviewed. Teach back method used, pt expressed understanding, expect good compliance.  If nutrition issues arise, please consult RD.  Tilda Franco, MS, RD, LDN Inpatient Clinical Dietitian Contact information available via Amion

## 2020-04-11 NOTE — Progress Notes (Signed)
Patient alert and oriented, Post op day 2.  Provided support and encouragement.  Encouraged pulmonary toilet, ambulation and small sips of liquids.  All questions answered.  Will continue to monitor. 

## 2020-04-12 ENCOUNTER — Encounter: Payer: Self-pay | Admitting: Family Medicine

## 2020-04-16 DIAGNOSIS — M9902 Segmental and somatic dysfunction of thoracic region: Secondary | ICD-10-CM | POA: Diagnosis not present

## 2020-04-16 DIAGNOSIS — M546 Pain in thoracic spine: Secondary | ICD-10-CM | POA: Diagnosis not present

## 2020-04-16 DIAGNOSIS — M542 Cervicalgia: Secondary | ICD-10-CM | POA: Diagnosis not present

## 2020-04-16 DIAGNOSIS — M9901 Segmental and somatic dysfunction of cervical region: Secondary | ICD-10-CM | POA: Diagnosis not present

## 2020-04-17 DIAGNOSIS — M9901 Segmental and somatic dysfunction of cervical region: Secondary | ICD-10-CM | POA: Diagnosis not present

## 2020-04-17 DIAGNOSIS — M546 Pain in thoracic spine: Secondary | ICD-10-CM | POA: Diagnosis not present

## 2020-04-17 DIAGNOSIS — M9902 Segmental and somatic dysfunction of thoracic region: Secondary | ICD-10-CM | POA: Diagnosis not present

## 2020-04-17 DIAGNOSIS — M542 Cervicalgia: Secondary | ICD-10-CM | POA: Diagnosis not present

## 2020-04-18 ENCOUNTER — Telehealth (HOSPITAL_COMMUNITY): Payer: Self-pay | Admitting: *Deleted

## 2020-04-18 DIAGNOSIS — M9901 Segmental and somatic dysfunction of cervical region: Secondary | ICD-10-CM | POA: Diagnosis not present

## 2020-04-18 DIAGNOSIS — M542 Cervicalgia: Secondary | ICD-10-CM | POA: Diagnosis not present

## 2020-04-18 DIAGNOSIS — M546 Pain in thoracic spine: Secondary | ICD-10-CM | POA: Diagnosis not present

## 2020-04-18 DIAGNOSIS — M9902 Segmental and somatic dysfunction of thoracic region: Secondary | ICD-10-CM | POA: Diagnosis not present

## 2020-04-18 NOTE — Telephone Encounter (Signed)
Patient called to discuss post bariatric surgery follow up questions.  See below:   1.  Tell me about your pain and pain management? Pt denies pain.  2.  Let's talk about fluid intake.  How much total fluid are you taking in? Pt states that she is getting in at least 64 oz of fluid with the protein shakes, chicken broth, decaf coffee, popsicles and water.  3.  How much protein have you taken in the last 2 days? Pt states that she is meeting her goal of 60g of protein each day with her protein shakes.  4.  Have you had nausea?  Tell me about when have experienced nausea and what you did to help? Pt denies nausea.  5.  Has the frequency or color changed with your urine? Pt states that she is "using the bathroom fine" without any changes in frequency or color.  6.  Tell me what your incisions look like? Pt states that her incisions look good.  3/6 sites no longer have steri strips.  Pt denies any redness, swelling, or drainage.  7.  Have you been passing gas? BM? Pt states that she has had a BM and it was normal.  Denies any constipation or diarrhea.  8.  If a problem or question were to arise who would you call?  Do you know contact numbers for [facility], [facility], and [facility]? Pt can verbalize s/sx of dehydration.  Pt states she knows to call [facility] for surgical, [facility] for nutrition, and [facility] for non-urgent questions/concerns.  9.  How has the walking going? Pt states she is walking around without difficulty.  10.  How are your vitamins and calcium going?  How are you taking them? Pt states she is taking her medications without difficulty and on a schedule to make sure they are at least 2 hours apart.

## 2020-04-23 DIAGNOSIS — M546 Pain in thoracic spine: Secondary | ICD-10-CM | POA: Diagnosis not present

## 2020-04-23 DIAGNOSIS — M542 Cervicalgia: Secondary | ICD-10-CM | POA: Diagnosis not present

## 2020-04-23 DIAGNOSIS — M9902 Segmental and somatic dysfunction of thoracic region: Secondary | ICD-10-CM | POA: Diagnosis not present

## 2020-04-23 DIAGNOSIS — M9901 Segmental and somatic dysfunction of cervical region: Secondary | ICD-10-CM | POA: Diagnosis not present

## 2020-04-24 ENCOUNTER — Other Ambulatory Visit: Payer: Self-pay

## 2020-04-24 ENCOUNTER — Encounter: Payer: No Typology Code available for payment source | Attending: Surgery | Admitting: Skilled Nursing Facility1

## 2020-04-25 DIAGNOSIS — M546 Pain in thoracic spine: Secondary | ICD-10-CM | POA: Diagnosis not present

## 2020-04-25 DIAGNOSIS — M9901 Segmental and somatic dysfunction of cervical region: Secondary | ICD-10-CM | POA: Diagnosis not present

## 2020-04-25 DIAGNOSIS — M9902 Segmental and somatic dysfunction of thoracic region: Secondary | ICD-10-CM | POA: Diagnosis not present

## 2020-04-25 DIAGNOSIS — M542 Cervicalgia: Secondary | ICD-10-CM | POA: Diagnosis not present

## 2020-04-26 NOTE — Progress Notes (Signed)
2 Week Post-Operative Nutrition Class   Patient was seen on 05/04/18 for Post-Operative Nutrition education at the [facility].    Surgery date: 04/10/2020 Surgery type: sleeve Start weight at [facility]: 390 (10/12/2019) Weight today: 271  Body Composition Scale   Current Body Weight 271  Total Body Fat % 46.2  Visceral Fat 16  Fat-Free Mass % 53.7   Total Body Water % 41.3  Muscle-Mass lbs 33.3  BMI 44.8  Body Fat Displacement          Torso  lbs 77.6         Left Leg  lbs 15.5         Right Leg  lbs 15.5         Left Arm  lbs 7.7         Right Arm   lbs 7.7      The following the learning objectives were met by the patient during this course:  Identifies Phase 3 (Soft, High Proteins) Dietary Goals and will begin from 2 weeks post-operatively to 2 months post-operatively  Identifies appropriate sources of fluids and proteins   Identifies appropriate fat sources and healthy verses unhealthy fat types    States protein recommendations and appropriate sources post-operatively  Identifies the need for appropriate texture modifications, mastication, and bite sizes when consuming solids  Identifies appropriate multivitamin and calcium sources post-operatively  Describes the need for physical activity post-operatively and will follow MD recommendations  States when to call healthcare provider regarding medication questions or post-operative complications   Handouts given during class include:  Phase 3A: Soft, High Protein Diet Handout  Phase 3 High Protein Meals  Healthy Fats   Follow-Up Plan: Patient will follow-up at [facility] in 6 weeks for 2 month post-op nutrition visit for diet advancement per MD.

## 2020-04-30 ENCOUNTER — Telehealth: Payer: Self-pay | Admitting: Skilled Nursing Facility1

## 2020-04-30 DIAGNOSIS — M9902 Segmental and somatic dysfunction of thoracic region: Secondary | ICD-10-CM | POA: Diagnosis not present

## 2020-04-30 DIAGNOSIS — M546 Pain in thoracic spine: Secondary | ICD-10-CM | POA: Diagnosis not present

## 2020-04-30 DIAGNOSIS — M9901 Segmental and somatic dysfunction of cervical region: Secondary | ICD-10-CM | POA: Diagnosis not present

## 2020-04-30 DIAGNOSIS — M542 Cervicalgia: Secondary | ICD-10-CM | POA: Diagnosis not present

## 2020-04-30 NOTE — Telephone Encounter (Signed)
RD called pt to verify fluid intake once starting soft, solid proteins 2 week post-bariatric surgery.   Daily Fluid intake: 64 oz Daily Protein intake: 60 g  Concerns/issues:   Pt states she gets full fast but has been focusing on seafood so going well.

## 2020-05-02 DIAGNOSIS — M546 Pain in thoracic spine: Secondary | ICD-10-CM | POA: Diagnosis not present

## 2020-05-02 DIAGNOSIS — M9901 Segmental and somatic dysfunction of cervical region: Secondary | ICD-10-CM | POA: Diagnosis not present

## 2020-05-02 DIAGNOSIS — M542 Cervicalgia: Secondary | ICD-10-CM | POA: Diagnosis not present

## 2020-05-02 DIAGNOSIS — M9902 Segmental and somatic dysfunction of thoracic region: Secondary | ICD-10-CM | POA: Diagnosis not present

## 2020-05-09 DIAGNOSIS — M9902 Segmental and somatic dysfunction of thoracic region: Secondary | ICD-10-CM | POA: Diagnosis not present

## 2020-05-09 DIAGNOSIS — M542 Cervicalgia: Secondary | ICD-10-CM | POA: Diagnosis not present

## 2020-05-09 DIAGNOSIS — M546 Pain in thoracic spine: Secondary | ICD-10-CM | POA: Diagnosis not present

## 2020-05-09 DIAGNOSIS — M9901 Segmental and somatic dysfunction of cervical region: Secondary | ICD-10-CM | POA: Diagnosis not present

## 2020-05-10 DIAGNOSIS — M9901 Segmental and somatic dysfunction of cervical region: Secondary | ICD-10-CM | POA: Diagnosis not present

## 2020-05-10 DIAGNOSIS — M9902 Segmental and somatic dysfunction of thoracic region: Secondary | ICD-10-CM | POA: Diagnosis not present

## 2020-05-10 DIAGNOSIS — M546 Pain in thoracic spine: Secondary | ICD-10-CM | POA: Diagnosis not present

## 2020-05-10 DIAGNOSIS — M542 Cervicalgia: Secondary | ICD-10-CM | POA: Diagnosis not present

## 2020-05-14 DIAGNOSIS — M546 Pain in thoracic spine: Secondary | ICD-10-CM | POA: Diagnosis not present

## 2020-05-14 DIAGNOSIS — M542 Cervicalgia: Secondary | ICD-10-CM | POA: Diagnosis not present

## 2020-05-14 DIAGNOSIS — M9901 Segmental and somatic dysfunction of cervical region: Secondary | ICD-10-CM | POA: Diagnosis not present

## 2020-05-14 DIAGNOSIS — M9902 Segmental and somatic dysfunction of thoracic region: Secondary | ICD-10-CM | POA: Diagnosis not present

## 2020-05-16 DIAGNOSIS — M542 Cervicalgia: Secondary | ICD-10-CM | POA: Diagnosis not present

## 2020-05-16 DIAGNOSIS — M546 Pain in thoracic spine: Secondary | ICD-10-CM | POA: Diagnosis not present

## 2020-05-16 DIAGNOSIS — M9901 Segmental and somatic dysfunction of cervical region: Secondary | ICD-10-CM | POA: Diagnosis not present

## 2020-05-16 DIAGNOSIS — M9902 Segmental and somatic dysfunction of thoracic region: Secondary | ICD-10-CM | POA: Diagnosis not present

## 2020-05-21 DIAGNOSIS — M542 Cervicalgia: Secondary | ICD-10-CM | POA: Diagnosis not present

## 2020-05-21 DIAGNOSIS — M9901 Segmental and somatic dysfunction of cervical region: Secondary | ICD-10-CM | POA: Diagnosis not present

## 2020-05-21 DIAGNOSIS — M9902 Segmental and somatic dysfunction of thoracic region: Secondary | ICD-10-CM | POA: Diagnosis not present

## 2020-05-21 DIAGNOSIS — M546 Pain in thoracic spine: Secondary | ICD-10-CM | POA: Diagnosis not present

## 2020-05-22 DIAGNOSIS — M546 Pain in thoracic spine: Secondary | ICD-10-CM | POA: Diagnosis not present

## 2020-05-22 DIAGNOSIS — M9901 Segmental and somatic dysfunction of cervical region: Secondary | ICD-10-CM | POA: Diagnosis not present

## 2020-05-22 DIAGNOSIS — M9902 Segmental and somatic dysfunction of thoracic region: Secondary | ICD-10-CM | POA: Diagnosis not present

## 2020-05-22 DIAGNOSIS — M542 Cervicalgia: Secondary | ICD-10-CM | POA: Diagnosis not present

## 2020-05-31 DIAGNOSIS — M9901 Segmental and somatic dysfunction of cervical region: Secondary | ICD-10-CM | POA: Diagnosis not present

## 2020-05-31 DIAGNOSIS — M542 Cervicalgia: Secondary | ICD-10-CM | POA: Diagnosis not present

## 2020-05-31 DIAGNOSIS — M9902 Segmental and somatic dysfunction of thoracic region: Secondary | ICD-10-CM | POA: Diagnosis not present

## 2020-05-31 DIAGNOSIS — M546 Pain in thoracic spine: Secondary | ICD-10-CM | POA: Diagnosis not present

## 2020-06-05 ENCOUNTER — Other Ambulatory Visit: Payer: Self-pay

## 2020-06-05 ENCOUNTER — Encounter: Payer: No Typology Code available for payment source | Attending: Surgery | Admitting: Skilled Nursing Facility1

## 2020-06-05 NOTE — Progress Notes (Signed)
Bariatric Nutrition Follow-Up Visit Medical Nutrition Therapy  2 Months Post-Operative LSG Surgery Surgery Date: 04/10/2020  NUTRITION ASSESSMENT    Anthropometrics  Start weight at [facility]: 390 lbs (date: 10/12/2019)  Current weight: 257.3 lbs (06/05/2020) Height: 66 in BMI: 41.53 kg/m2     Clinical  Medical hx: anxiety, depression, kidney stones, reflux  Medications: see list  Labs:  Notable signs/symptoms: headaches, back pain, balding   Any previous deficiencies? Yes, vitamin D  Body Composition Scale 04/24/2020 06/05/20  Current Body Weight 271 257.3  Total Body Fat % 46.2 44.9  Visceral Fat 16 15  Fat-Free Mass % 53.7 55   Total Body Water % 41.3 42  Muscle-Mass lbs 33.3 33.1  BMI 44.8 42.5  Body Fat Displacement           Torso  lbs 77.6 71.7         Left Leg  lbs 15.5 14.3         Right Leg  lbs 15.5 14.3         Left Arm  lbs 7.7 7.1         Right Arm   lbs 7.7 7.1    Lifestyle & Dietary Hx  Pt states that she is trying to increase her physical activity and has been having longer workouts. Pt states that she is able to understand when she is feeling hungry and when she's feeling full. Pt states she is weighing herself once a week.   Estimated daily fluid intake: 40-50 oz Estimated daily protein intake: 65-70 g Supplements: MVI, calcium 3x/day Current average weekly physical activity: walking twice a day (30 minutes) 5 days/week  24-Hr Dietary Recall First Meal: 1 eggs, steak or chicken Snack:  Second Meal: 3-4 oz chicken or steak Snack: cheese  Third Meal: 3-4 oz fish or chicken Snack: keto nut clusters  Beverages: water with crystal light, decaf coffee (12 oz)  Post-Op Goals/ Signs/ Symptoms Using straws: yes Drinking while eating: no Chewing/swallowing difficulties: no Changes in vision: no Changes to mood/headaches: no Hair loss/changes to skin/nails: no Difficulty focusing/concentrating: no Sweating: no Dizziness/lightheadedness:  no Palpitations: no Carbonated/caffeinated beverages: no N/V/D/C/Gas: constipation, every 2-3 days she'll go Abdominal pain: no Dumping syndrome: no    NUTRITION DIAGNOSIS  Overweight/obesity ([place]-3.3) related to past poor dietary habits and physical inactivity as evidenced by completed bariatric surgery and following dietary guidelines for continued weight loss and healthy nutrition status.     NUTRITION INTERVENTION Nutrition counseling (C-1) and education (E-2) to facilitate bariatric surgery goals, including: . Diet advancement to the next phase (phase 4) now including non-starchy vegetables . The importance of consuming adequate calories as well as certain nutrients daily due to the body's need for essential vitamins, minerals, and fats . The importance of daily physical activity and to reach a goal of at least 150 minutes of moderate to vigorous physical activity weekly (or as directed by their physician) due to benefits such as increased musculature and improved lab values . The importance of intuitive eating specifically learning hunger-satiety cues and understanding the importance of learning a new body: The importance of mindful eating to avoid grazing behaviors   Goals: -Continue to aim for a minimum of 64 fluid ounces 7 days a week with at least 30 ounces being plain water -Eat non-starchy vegetables 2 times a day 7 days a week -Start out with soft cooked vegetables today and tomorrow; if tolerated begin to eat raw vegetables or cooked including salads -Eat your 3  ounces of protein first then start in on your non-starchy vegetables; once you understand how much of your meal leads to satisfaction and not full while still eating 3 ounces of protein and non-starchy vegetables you can eat them in any order  -Continue to aim for 30 minutes of activity at least 5 times a week -Do NOT cook with/add to your food: alfredo sauce, cheese sauce, barbeque sauce, ketchup, fat back, butter,  bacon grease, grease, Crisco, OR SUGAR   Handouts Provided Include   Phase 4 Food plan  Learning Style & Readiness for Change Teaching method utilized: Visual & Auditory  Demonstrated degree of understanding via: Teach Back  Readiness Level: action Barriers to learning/adherence to lifestyle change: none identified  RD's Notes for Next Visit . Assess adherence to pt chosen goals   MONITORING & EVALUATION Dietary intake, weekly physical activity, body weight  Next Steps Patient is to follow-up

## 2020-06-07 DIAGNOSIS — M9902 Segmental and somatic dysfunction of thoracic region: Secondary | ICD-10-CM | POA: Diagnosis not present

## 2020-06-07 DIAGNOSIS — M546 Pain in thoracic spine: Secondary | ICD-10-CM | POA: Diagnosis not present

## 2020-06-07 DIAGNOSIS — M542 Cervicalgia: Secondary | ICD-10-CM | POA: Diagnosis not present

## 2020-06-07 DIAGNOSIS — M9901 Segmental and somatic dysfunction of cervical region: Secondary | ICD-10-CM | POA: Diagnosis not present

## 2020-06-14 DIAGNOSIS — M546 Pain in thoracic spine: Secondary | ICD-10-CM | POA: Diagnosis not present

## 2020-06-14 DIAGNOSIS — M9902 Segmental and somatic dysfunction of thoracic region: Secondary | ICD-10-CM | POA: Diagnosis not present

## 2020-06-14 DIAGNOSIS — M542 Cervicalgia: Secondary | ICD-10-CM | POA: Diagnosis not present

## 2020-06-14 DIAGNOSIS — M9901 Segmental and somatic dysfunction of cervical region: Secondary | ICD-10-CM | POA: Diagnosis not present

## 2020-06-19 DIAGNOSIS — M9902 Segmental and somatic dysfunction of thoracic region: Secondary | ICD-10-CM | POA: Diagnosis not present

## 2020-06-19 DIAGNOSIS — M546 Pain in thoracic spine: Secondary | ICD-10-CM | POA: Diagnosis not present

## 2020-06-19 DIAGNOSIS — M9901 Segmental and somatic dysfunction of cervical region: Secondary | ICD-10-CM | POA: Diagnosis not present

## 2020-06-19 DIAGNOSIS — M542 Cervicalgia: Secondary | ICD-10-CM | POA: Diagnosis not present

## 2020-06-20 ENCOUNTER — Telehealth: Payer: Self-pay | Admitting: Skilled Nursing Facility1

## 2020-06-20 NOTE — Telephone Encounter (Signed)
Great! Caffeinated coffee is fine as long as you do not have a racing heart beat and do not drink more than 16 ounces  The stall is normal meaning it does not indicate you are doing anything wrong.  From: Luretha [email or web address]>  Sent: Wednesday, June 20, 2020 2:25 PM To: Millette [email or web address]> Subject: Re: Questions  *Caution - External email - see footer for warnings* No to all of the dehydration questions. ??  Any thoughts on how to break stall or full caf coffee?

## 2020-06-20 NOTE — Telephone Encounter (Signed)
Response:    Some things to consider if you think you are dehydrated:  Are you getting under 40 ounces of TOTAL fluid per day Are you having any headaches, dry mouth, or dizziness If yes to these questions call your surgoen   Fat bombs are typically high in saturated fat which is an issue when considering heart health    Also, can I try drinking regular coffee instead of decaf?      From: Luretha [email or web address]> Sent: Wednesday, June 20, 2020 11:38:54 AM To: Serena [email or web address]> Subject: Questions    [name],  A few questions...  I think I am hitting that 3 month stall we talked about. Any ideas on what to do to get moving again?  Still having trouble getting my water in. Do you recommend using those liquid IV drinks (ugh they have sugar)? Or getting an IV? Don't think I'm dehydrated. Urine is still light yellow, no headaches or anything.   What's your feeling on fat bombs? Made with cream cheese, butter, sugar substitute amd sugar free chocolate chips.   Thank you!

## 2020-06-28 DIAGNOSIS — M546 Pain in thoracic spine: Secondary | ICD-10-CM | POA: Diagnosis not present

## 2020-06-28 DIAGNOSIS — M9901 Segmental and somatic dysfunction of cervical region: Secondary | ICD-10-CM | POA: Diagnosis not present

## 2020-06-28 DIAGNOSIS — M9902 Segmental and somatic dysfunction of thoracic region: Secondary | ICD-10-CM | POA: Diagnosis not present

## 2020-06-28 DIAGNOSIS — M542 Cervicalgia: Secondary | ICD-10-CM | POA: Diagnosis not present

## 2020-07-25 ENCOUNTER — Other Ambulatory Visit: Payer: Self-pay

## 2020-07-25 ENCOUNTER — Encounter: Payer: No Typology Code available for payment source | Attending: Surgery | Admitting: Skilled Nursing Facility1

## 2020-07-25 NOTE — Progress Notes (Signed)
Bariatric Nutrition Follow-Up Visit Medical Nutrition Therapy  Post-Operative LSG Surgery Surgery Date: 04/10/2020  NUTRITION ASSESSMENT    Anthropometrics  Start weight at [facility]: 290 lbs (date: 10/12/2019)  Current weight: 244.3 lbs Height: 66 in BMI: 39.43 kg/m2     Clinical  Medical hx: anxiety, depression, kidney stones, reflux  Medications: see list  Labs:  Notable signs/symptoms: headaches, back pain, balding   Any previous deficiencies? Yes, vitamin D  Body Composition Scale 04/24/2020 06/05/20 07/25/2020  Current Body Weight 271 257.3 244.3  Total Body Fat % 46.2 44.9 43.7  Visceral Fat 16 15   Fat-Free Mass % 53.7 55    Total Body Water % 41.3 42 42.6  Muscle-Mass lbs 33.3 33.1   BMI 44.8 42.5   Body Fat Displacement            Torso  lbs 77.6 71.7          Left Leg  lbs 15.5 14.3          Right Leg  lbs 15.5 14.3          Left Arm  lbs 7.7 7.1          Right Arm   lbs 7.7 7.1     Lifestyle & Dietary Hx   Pt states she had a stomach bug for about 1 week but is feeling better now with no nausea.   Pt is actively working on her relationship with food and feels good about the path she is on but is disapointed with her weight loss thus far: Dietitian counseled pt on cheese consumption and the caloric value it offers to her meals. Pt states she loves cheese but will work on reducing it.   Estimated daily fluid intake: 50 oz Estimated daily protein intake: 80 g Supplements: MVI, calcium 3x/day Current average weekly physical activity: walking twice a day (30 minutes) 3 days/week; 20 youtube exercise videos (breathing heavy)  24-Hr Dietary Recall First Meal: eggs + cheese or greek yogurt + granola Snack: coffee + sugar free creamer + stevia Second Meal: cheese + [place] meat or procuito  Snack: nuts + cheese Third Meal: chicken thigh or breast or ribs + cauliflower + cheese or green beans or carrots (thining maybe 1/4 cup) Snack: protein  pudding or protein ice  cream (10g pro guesstimate) Beverages: water with crystal light, coffee (12 oz), water  Post-Op Goals/ Signs/ Symptoms Using straws: yes Drinking while eating: no Chewing/swallowing difficulties: no Changes in vision: no Changes to mood/headaches: no Hair loss/changes to skin/nails: no Difficulty focusing/concentrating: no Sweating: no Dizziness/lightheadedness: no Palpitations: no Carbonated/caffeinated beverages: no N/V/D/C/Gas: constipation, every 2-3 days with strain and sometimes coming out fast; using stool softener 2 times a day Abdominal pain: no Dumping syndrome: no    NUTRITION DIAGNOSIS  Overweight/obesity ([place]-3.3) related to past poor dietary habits and physical inactivity as evidenced by completed bariatric surgery and following dietary guidelines for continued weight loss and healthy nutrition status.     NUTRITION INTERVENTION Nutrition counseling (C-1) and education (E-2) to facilitate bariatric surgery goals, including: . Diet advancement to the next phase (phase 5) now including starchy vegetables . The importance of consuming adequate calories as well as certain nutrients daily due to the body's need for essential vitamins, minerals, and fats . The importance of daily physical activity and to reach a goal of at least 150 minutes of moderate to vigorous physical activity weekly (or as directed by their physician) due to benefits such as increased  musculature and improved lab values . The importance of intuitive eating specifically learning hunger-satiety cues and understanding the importance of learning a new body: The importance of mindful eating to avoid grazing behaviors  Importance of vegetables . To have an overall healthy diet, adult men and women are recommended to consume anywhere from 2-3 cups of vegetables daily. Vegetables provide a wide range of vitamins and minerals such as vitamin A, vitamin C, potassium, and folic acid. According to the [employer], including fruit and vegetables daily may reduce the risk of cardiovascular disease, certain cancers, and other non-communicable diseases  Goals:  -Try some resistance bands 2-3 times a week -Try veggies for snacks like cooked carrots or squash  -Aim for non starcy veggies 2 times a day 7 days a Week at least 1/2 cup per meal  -Add in some potato or corn or peas throughout the week and when not eating those have some garbanzo beans or any other bean   Handouts Provided Include     Learning Style & Readiness for Change Teaching method utilized: Visual & Auditory  Demonstrated degree of understanding via: Teach Back  Readiness Level: action Barriers to learning/adherence to lifestyle change: none identified  RD's Notes for Next Visit . Assess adherence to pt chosen goals   MONITORING & EVALUATION Dietary intake, weekly physical activity, body weight  Next Steps Patient is to follow-up 3 months

## 2020-09-04 ENCOUNTER — Ambulatory Visit: Payer: No Typology Code available for payment source | Admitting: Skilled Nursing Facility1

## 2020-10-22 ENCOUNTER — Encounter: Payer: No Typology Code available for payment source | Attending: Surgery | Admitting: Skilled Nursing Facility1

## 2020-10-22 ENCOUNTER — Other Ambulatory Visit: Payer: Self-pay

## 2020-10-22 NOTE — Progress Notes (Signed)
Bariatric Nutrition Follow-Up Visit Medical Nutrition Therapy  Post-Operative LSG Surgery Surgery Date: 04/10/2020  NUTRITION ASSESSMENT    Anthropometrics  Start weight at [facility]: 290 lbs (date: 10/12/2019)  Current weight: 229.3 lbs Height: 66 in BMI: 37.09 kg/m2     Clinical  Medical hx: anxiety, depression, kidney stones, reflux  Medications: see list  Labs:  Notable signs/symptoms: headaches, back pain, balding   Any previous deficiencies? Yes, vitamin D  Body Composition Scale 04/24/2020 06/05/20 07/25/2020  Current Body Weight 271 257.3 244.3  Total Body Fat % 46.2 44.9 43.7  Visceral Fat 16 15   Fat-Free Mass % 53.7 55    Total Body Water % 41.3 42 42.6  Muscle-Mass lbs 33.3 33.1   BMI 44.8 42.5   Body Fat Displacement            Torso  lbs 77.6 71.7          Left Leg  lbs 15.5 14.3          Right Leg  lbs 15.5 14.3          Left Arm  lbs 7.7 7.1          Right Arm   lbs 7.7 7.1     Lifestyle & Dietary Hx  Pt states she feels she needed more protein so she increased it to 80 grams per day.  Pt states she is still struggling to to not feel she is not losing enough weight but has been working through that.    Estimated daily fluid intake: 50 oz Estimated daily protein intake: 80 g Supplements: MVI, calcium 3x/day Current average weekly physical activity: walking twice a day (30 minutes) 3 days/week; 20 youtube exercise videos (breathing heavy)  24-Hr Dietary Recall First Meal: protein shake Snack: yogurt + hemp + chia Second Meal: high fiber wrap + chicken + lettuce Snack: protein bar + coffee Third Meal: chicken + green beans or sweet potatoes Snack: keto nut clusters Beverages: water with crystal light, coffee (12 oz), water  Post-Op Goals/ Signs/ Symptoms Using straws: yes Drinking while eating: no Chewing/swallowing difficulties: no Changes in vision: no Changes to mood/headaches: no Hair loss/changes to skin/nails: no Difficulty  focusing/concentrating: no Sweating: no Dizziness/lightheadedness: no Palpitations: no Carbonated/caffeinated beverages: no N/V/D/C/Gas: no Abdominal pain: no Dumping syndrome: no    NUTRITION DIAGNOSIS  Overweight/obesity ([place]-3.3) related to past poor dietary habits and physical inactivity as evidenced by completed bariatric surgery and following dietary guidelines for continued weight loss and healthy nutrition status.     NUTRITION INTERVENTION Nutrition counseling (C-1) and education (E-2) to facilitate bariatric surgery goals, including: The importance of consuming adequate calories as well as certain nutrients daily due to the body's need for essential vitamins, minerals, and fats The importance of daily physical activity and to reach a goal of at least 150 minutes of moderate to vigorous physical activity weekly (or as directed by their physician) due to benefits such as increased musculature and improved lab values The importance of intuitive eating specifically learning hunger-satiety cues and understanding the importance of learning a new body: The importance of mindful eating to avoid grazing behaviors  Importance of vegetables To have an overall healthy diet, adult men and women are recommended to consume anywhere from 2-3 cups of vegetables daily. Vegetables provide a wide range of vitamins and minerals such as vitamin A, vitamin C, potassium, and folic acid. According to the [employer], including fruit and vegetables daily may reduce the risk of  cardiovascular disease, certain cancers, and other non-communicable diseases  Goals: continued  -Try some resistance bands 2-3 times a week -Try veggies for snacks like cooked carrots or squash  -Aim for non starcy veggies 2 times a day 7 days a Week at least 1/2 cup per meal  -Add in some potato or corn or peas throughout the week and when not eating those have some garbanzo beans or any other bean -have at least half  your water be plain   Handouts Provided Include    Learning Style & Readiness for Change Teaching method utilized: Visual & Auditory  Demonstrated degree of understanding via: Teach Back  Readiness Level: action Barriers to learning/adherence to lifestyle change: none identified  RD's Notes for Next Visit Assess adherence to pt chosen goals   MONITORING & EVALUATION Dietary intake, weekly physical activity, body weight  Next Steps Patient is to follow-up 3 months

## 2020-11-05 DIAGNOSIS — M9904 Segmental and somatic dysfunction of sacral region: Secondary | ICD-10-CM | POA: Diagnosis not present

## 2020-11-05 DIAGNOSIS — M9901 Segmental and somatic dysfunction of cervical region: Secondary | ICD-10-CM | POA: Diagnosis not present

## 2020-11-05 DIAGNOSIS — M9903 Segmental and somatic dysfunction of lumbar region: Secondary | ICD-10-CM | POA: Diagnosis not present

## 2020-11-05 DIAGNOSIS — M9902 Segmental and somatic dysfunction of thoracic region: Secondary | ICD-10-CM | POA: Diagnosis not present

## 2020-11-15 DIAGNOSIS — M9903 Segmental and somatic dysfunction of lumbar region: Secondary | ICD-10-CM | POA: Diagnosis not present

## 2020-11-15 DIAGNOSIS — M9901 Segmental and somatic dysfunction of cervical region: Secondary | ICD-10-CM | POA: Diagnosis not present

## 2020-11-15 DIAGNOSIS — M9904 Segmental and somatic dysfunction of sacral region: Secondary | ICD-10-CM | POA: Diagnosis not present

## 2020-11-15 DIAGNOSIS — M9902 Segmental and somatic dysfunction of thoracic region: Secondary | ICD-10-CM | POA: Diagnosis not present

## 2020-11-20 DIAGNOSIS — G8929 Other chronic pain: Secondary | ICD-10-CM | POA: Diagnosis not present

## 2020-11-29 ENCOUNTER — Ambulatory Visit (INDEPENDENT_AMBULATORY_CARE_PROVIDER_SITE_OTHER): Payer: No Typology Code available for payment source | Admitting: Family Medicine

## 2020-11-29 ENCOUNTER — Other Ambulatory Visit: Payer: Self-pay

## 2020-11-29 ENCOUNTER — Encounter: Payer: Self-pay | Admitting: Family Medicine

## 2020-11-29 VITALS — Ht 65.0 in | Wt 227.0 lb

## 2020-11-29 DIAGNOSIS — M5412 Radiculopathy, cervical region: Secondary | ICD-10-CM | POA: Insufficient documentation

## 2020-11-29 MED ORDER — GABAPENTIN 300 MG PO CAPS: 300.0000 mg | ORAL_CAPSULE | Freq: Three times a day (TID) | ORAL | 1 refills | Status: DC

## 2020-11-29 NOTE — Patient Instructions (Signed)
Nice to meet you Please try the gabapentin. Please start with one pill at night and then increase to 2 or 3 times daily as you tolerate  Please try the exercises  Please try physical therapy   Please send me a message in MyChart with any questions or updates.  Please see me back in 4 weeks.   --Dr. Jordan Likes

## 2020-11-29 NOTE — Assessment & Plan Note (Signed)
Symptoms most consistent with radicular type pain.  Has limited range of motion lateral rotation to the right. -Counseled on home exercise therapy and supportive care. -Gabapentin -Referral to physical therapy. -Consider further imaging.

## 2020-11-29 NOTE — Progress Notes (Signed)
  [name] - 40 y.o. female MRN [id number]  Date of birth: 26-Oct-1980  SUBJECTIVE:  Including CC & ROS.  No chief complaint on file.   [name] is a 40 y.o. female that is presenting with right-sided arm pain and neck pain for the past month.  It is on a daily basis and severe in nature.  States hearing no inciting event or trauma.  No history of surgery in the area.    Review of Systems See HPI   HISTORY: Past Medical, Surgical, Social, and Family History Reviewed & Updated per EMR.   Pertinent Historical Findings include:  Past Medical History:  Diagnosis Date   Anxiety    Depression    GERD (gastroesophageal reflux disease)    History of hiatal hernia    History of kidney stones    Kidney stones    PONV (postoperative nausea and vomiting)     Past Surgical History:  Procedure Laterality Date   BREAST SURGERY     reduction   CESAREAN SECTION  08/2007   x2   CHOLECYSTECTOMY     EYE SURGERY     as a child   LAPAROSCOPIC GASTRIC SLEEVE RESECTION N/A 04/10/2020   Procedure: LAPAROSCOPIC GASTRIC SLEEVE RESECTION;  Surgeon: Berna Bue, MD;  Location: [facility];  Service: General;  Laterality: N/A;   UPPER GI ENDOSCOPY N/A 04/10/2020   Procedure: UPPER GI ENDOSCOPY;  Surgeon: Berna Bue, MD;  Location: [facility];  Service: General;  Laterality: N/A;    Family History  Problem Relation Age of Onset   Diabetes Father    Kidney disease Father    Heart disease Father    Diabetes Paternal Grandmother    Cervical cancer Sister     Social History   Socioeconomic History   Marital status: Married    Spouse name: Not on file   Number of children: Not on file   Years of education: Not on file   Highest education level: Not on file  Occupational History   Occupation: [employer]    Employer: [employer]  Tobacco Use   Smoking status: Former    Packs/day: 0.25    Years: 5.00    Pack years: 1.25    Types: Cigarettes    Quit date: 2005    Years  since quitting: 17.7   Smokeless tobacco: Never  Vaping Use   Vaping Use: Never used  Substance and Sexual Activity   Alcohol use: Not Currently   Drug use: No   Sexual activity: Not on file  Other Topics Concern   Not on file  Social History Narrative   Married   1 son   1 daughter   Social Determinants of Health   Financial Resource Strain: Not on file  Food Insecurity: Not on file  Transportation Needs: Not on file  Physical Activity: Not on file  Stress: Not on file  Social Connections: Not on file  Intimate Partner Violence: Not on file     PHYSICAL EXAM:  VS: Ht 5\' 5"  (1.651 m)   Wt 227 lb (103 kg)   BMI 37.77 kg/m  Physical Exam Gen: NAD, alert, cooperative with exam, well-appearing      ASSESSMENT & PLAN:   Cervical radiculopathy Symptoms most consistent with radicular type pain.  Has limited range of motion lateral rotation to the right. -Counseled on home exercise therapy and supportive care. -Gabapentin -Referral to physical therapy. -Consider further imaging.

## 2020-12-04 ENCOUNTER — Telehealth: Payer: Self-pay | Admitting: Skilled Nursing Facility1

## 2020-12-04 DIAGNOSIS — M6281 Muscle weakness (generalized): Secondary | ICD-10-CM | POA: Diagnosis not present

## 2020-12-04 DIAGNOSIS — M2569 Stiffness of other specified joint, not elsewhere classified: Secondary | ICD-10-CM | POA: Diagnosis not present

## 2020-12-04 DIAGNOSIS — M5412 Radiculopathy, cervical region: Secondary | ICD-10-CM | POA: Diagnosis not present

## 2020-12-04 DIAGNOSIS — M542 Cervicalgia: Secondary | ICD-10-CM | POA: Diagnosis not present

## 2020-12-04 NOTE — Telephone Encounter (Signed)
I know, that's why apart of your journey is accepting you do not have 100% control over your weight. It will get there; it is just not happening as quickly as you want :/  Your weight was 229.3 pounds  Constipation is helped with 70+ fluid ounces and fibrous veggies such as broccoli, Brussel sprouts, cabbage and lots of them!  Feeling hunger is a good thing, being able to feel your hunger and act on it appropriately is how you achieve long term success. There is no hunger gland there are hunger hormones that are repressed for a finite amount of time after the surgery but then the stomach adapts and they are felt again 12     From: Luretha [email or web address]>  Sent: Tuesday, December 04, 2020 8:19 AM To: Serena Colonel [email or web address]> Subject: Re: Frustrated  *Caution - External email - see footer for warnings* Thank you. The whole thing is just frustrating. What did I weigh last time I was there? Probably the same as I do now! ????  I want to fix it but it's like my body is pissed at me. Doesn't help that I'm also constipated. They put me on gabapentin for the pinched nerve but I stopped taking it bc it seems to have messed up my gut.   Also, I feel hunger. Does that mean the surgery didn't remove my hunger gland? ??

## 2020-12-04 NOTE — Telephone Encounter (Signed)
Well: stalls can last for months and does not mean you are doing anything wrong, more protein means more calories so do not go over 80 grams per day, balance in your calorie distribution is extremely important so at least 1 cup of non-starchy vegetables with lunch AND dinner is a must and at least 1 snack with a veggie would be good, and get sweaty/out of breath 4 days a week for 45 minutes to an hour. If you are doing all that patience is key.   Be sure to make room for complex carbohydrates as well 12    From: Luretha Murphy [email or web address]>  Sent: Tuesday, December 04, 2020 7:04 AM To: Serena Colonel [email or web address]> Subject: Frustrated  *Caution - External email - see footer for warnings* Good morning,  I am super frustrated. I have been playing with 2-3 lbs for over a month. I go from 224 to 227. I have increased my water. Increased my protein. Walking more. I was on steroids last week for a pinched nerve in my neck, but this was happening before that.   Helllppppp!   [name]

## 2020-12-07 DIAGNOSIS — M542 Cervicalgia: Secondary | ICD-10-CM | POA: Diagnosis not present

## 2020-12-07 DIAGNOSIS — M6281 Muscle weakness (generalized): Secondary | ICD-10-CM | POA: Diagnosis not present

## 2020-12-07 DIAGNOSIS — M5412 Radiculopathy, cervical region: Secondary | ICD-10-CM | POA: Diagnosis not present

## 2020-12-07 DIAGNOSIS — M2569 Stiffness of other specified joint, not elsewhere classified: Secondary | ICD-10-CM | POA: Diagnosis not present

## 2020-12-31 ENCOUNTER — Ambulatory Visit: Payer: No Typology Code available for payment source | Admitting: Family Medicine

## 2021-01-16 ENCOUNTER — Encounter: Payer: No Typology Code available for payment source | Attending: Surgery | Admitting: Skilled Nursing Facility1

## 2021-01-16 ENCOUNTER — Other Ambulatory Visit: Payer: Self-pay

## 2021-01-16 NOTE — Progress Notes (Signed)
Bariatric Nutrition Follow-Up Visit Medical Nutrition Therapy  Post-Operative LSG Surgery Surgery Date: 04/10/2020  NUTRITION ASSESSMENT    Anthropometrics  Start weight at [facility]: 290 lbs (date: 10/12/2019)  Current weight: 226.3 lbs    Clinical  Medical hx: anxiety, depression, kidney stones, reflux  Medications: see list  Labs:  Notable signs/symptoms: headaches, back pain, balding   Any previous deficiencies? Yes, vitamin D  Body Composition Scale 04/24/2020 06/05/20 07/25/2020 01/16/2021  Current Body Weight 271 257.3 244.3 226.3  Total Body Fat % 46.2 44.9 43.7 41.6  Visceral Fat 16 15  12   Fat-Free Mass % 53.7 55  58.3   Total Body Water % 41.3 42 42.6 43.6  Muscle-Mass lbs 33.3 33.1  32.8  BMI 44.8 42.5  37.3  Body Fat Displacement             Torso  lbs 77.6 71.7  58.3         Left Leg  lbs 15.5 14.3  11.6         Right Leg  lbs 15.5 14.3  11.6         Left Arm  lbs 7.7 7.1  5.8         Right Arm   lbs 7.7 7.1  5.8    Lifestyle & Dietary Hx  Pt states she feels she needed more protein so she increased it to 100 grams per day after having increased it from 80g per day..   Pt states she does not like the taste of water she will only drink it if it has a flavoring in it. Pt states she gets bout 7.5 hours pf sleep and wakes feeling rested. Pt states she hates working out and works too long of days to e physically active. Pt states she has been feeling hungrier.   Estimated daily fluid intake: 50 oz Estimated daily protein intake: 100+ g Supplements: MVI, calcium 3x/day Current average weekly physical activity: ADL's  24-Hr Dietary Recall: sometimes protein ice cream First Meal 7am: black coffee + half and half, skinny syrup, protein powder (not a full serving of protein) sometimes full protein serving Snack 8:30: 1/2 cup oatmeal + protein powder (24 g) + hemp or chia or 2 egg fritata + cheese + broccoli or mushroom or peppers and onion + low car tortilla Second  Meal: chicken cesar salad (17g protein)  Snack: protein bar (13 g protein) Third Meal: chicken or steak + 1/4 cup rice Snack: keto chocolate (5 g protein and 5 fiber) Beverages: water with sugar free energy packet, coffee (12 oz)  Post-Op Goals/ Signs/ Symptoms Using straws: yes Drinking while eating: no Chewing/swallowing difficulties: no Changes in vision: no Changes to mood/headaches: no Hair loss/changes to skin/nails: no Difficulty focusing/concentrating: no Sweating: no Dizziness/lightheadedness: no Palpitations: no Carbonated/caffeinated beverages: no N/V/D/C/Gas: no Abdominal pain: no Dumping syndrome: no    NUTRITION DIAGNOSIS  Overweight/obesity ([place]-3.3) related to past poor dietary habits and physical inactivity as evidenced by completed bariatric surgery and following dietary guidelines for continued weight loss and healthy nutrition status.     NUTRITION INTERVENTION Nutrition counseling (C-1) and education (E-2) to facilitate bariatric surgery goals, including: The importance of consuming adequate calories as well as certain nutrients daily due to the body's need for essential vitamins, minerals, and fats The importance of daily physical activity and to reach a goal of at least 150 minutes of moderate to vigorous physical activity weekly (or as directed by their physician) due to benefits such as  increased musculature and improved lab values The importance of intuitive eating specifically learning hunger-satiety cues and understanding the importance of learning a new body: The importance of mindful eating to avoid grazing behaviors  Importance of vegetables To have an overall healthy diet, adult men and women are recommended to consume anywhere from 2-3 cups of vegetables daily. Vegetables provide a wide range of vitamins and minerals such as vitamin A, vitamin C, potassium, and folic acid. According to [employer], including fruit and vegetables daily  may reduce the risk of cardiovascular disease, certain cancers, and other non-communicable diseases  Goals: continued  -look into physical activity you think is fun like walking with a book on tape -Try veggies for snacks like cooked carrots or squash  -Aim for non starcy veggies 2 times a day 7 days a Week at least 1/2 cup per meal  -add in fruit -cut protein down to 80 grams   Handouts Provided Include    Learning Style & Readiness for Change Teaching method utilized: Visual & Auditory  Demonstrated degree of understanding via: Teach Back  Readiness Level: action Barriers to learning/adherence to lifestyle change: none identified  RD's Notes for Next Visit Assess adherence to pt chosen goals   MONITORING & EVALUATION Dietary intake, weekly physical activity, body weight  Next Steps Patient is to follow-up 3 months

## 2021-03-20 DIAGNOSIS — Z1231 Encounter for screening mammogram for malignant neoplasm of breast: Secondary | ICD-10-CM | POA: Diagnosis not present

## 2021-03-20 DIAGNOSIS — Z01419 Encounter for gynecological examination (general) (routine) without abnormal findings: Secondary | ICD-10-CM | POA: Diagnosis not present

## 2021-04-18 ENCOUNTER — Encounter: Payer: No Typology Code available for payment source | Attending: Surgery | Admitting: Skilled Nursing Facility1

## 2021-04-18 ENCOUNTER — Other Ambulatory Visit: Payer: Self-pay

## 2021-04-18 NOTE — Progress Notes (Signed)
Bariatric Nutrition Follow-Up Visit Medical Nutrition Therapy  Post-Operative LSG Surgery Surgery Date: 04/10/2020  NUTRITION ASSESSMENT    Anthropometrics  Start weight at [facility]: 290 lbs (date: 10/12/2019)  Current weight: 220.5 lbs  Clinical  Medical hx: anxiety, depression, kidney stones, reflux  Medications: see list  Labs:  Previous to surgery Notable signs/symptoms: headaches, back pain, balding   Any previous deficiencies? Yes, vitamin D  Body Composition Scale 04/24/2020 06/05/20 07/25/2020 01/16/2021 04/18/2021  Current Body Weight 271 257.3 244.3 226.3 220.5  Total Body Fat % 46.2 44.9 43.7 41.6 41  Visceral Fat 16 15  12 12   Fat-Free Mass % 53.7 55  58.3 58.9   Total Body Water % 41.3 42 42.6 43.6 43.9  Muscle-Mass lbs 33.3 33.1  32.8 32.6  BMI 44.8 42.5  37.3 36.4  Body Fat Displacement              Torso  lbs 77.6 71.7  58.3 56         Left Leg  lbs 15.5 14.3  11.6 11.2         Right Leg  lbs 15.5 14.3  11.6 11.2         Left Arm  lbs 7.7 7.1  5.8 5.6         Right Arm   lbs 7.7 7.1  5.8 5.6    Lifestyle & Dietary Hx  Pt states she Feels better and looks better and will continue that attitude even if the weight stays the same.  Estimated daily fluid intake: 50 oz Estimated daily protein intake: 100+ g Supplements: MVI, calcium 3x/day Current average weekly physical activity: treadmill and kettle bell 5 days a week  24-Hr Dietary Recall: sometimes protein ice cream First Meal 7am: black coffee + half and half, skinny syrup, protein powder (not a full serving of protein) sometimes full protein serving Snack 8:30: 1/2 cup oatmeal + protein powder (24 g) + hemp or chia or 2 egg fritata + cheese + broccoli or mushroom or peppers and onion + low car tortilla Second Meal: chicken cesar salad (17g protein)  Snack: protein bar (13 g protein) Third Meal: chicken or steak + 1/4 cup rice Snack: keto chocolate (5 g protein and 5 fiber) Beverages: water with sugar free  energy packet, coffee (12 oz)  Post-Op Goals/ Signs/ Symptoms Using straws: yes Drinking while eating: no Chewing/swallowing difficulties: no Changes in vision: no Changes to mood/headaches: no Hair loss/changes to skin/nails: no Difficulty focusing/concentrating: no Sweating: no Dizziness/lightheadedness: no Palpitations: no Carbonated/caffeinated beverages: no N/V/D/C/Gas: no Abdominal pain: no Dumping syndrome: no    NUTRITION DIAGNOSIS  Overweight/obesity ([place]-3.3) related to past poor dietary habits and physical inactivity as evidenced by completed bariatric surgery and following dietary guidelines for continued weight loss and healthy nutrition status.     NUTRITION INTERVENTION Nutrition counseling (C-1) and education (E-2) to facilitate bariatric surgery goals, including: The importance of consuming adequate calories as well as certain nutrients daily due to the body's need for essential vitamins, minerals, and fats The importance of daily physical activity and to reach a goal of at least 150 minutes of moderate to vigorous physical activity weekly (or as directed by their physician) due to benefits such as increased musculature and improved lab values The importance of intuitive eating specifically learning hunger-satiety cues and understanding the importance of learning a new body: The importance of mindful eating to avoid grazing behaviors  Importance of vegetables To have an overall healthy diet, adult  men and women are recommended to consume anywhere from 2-3 cups of vegetables daily. Vegetables provide a wide range of vitamins and minerals such as vitamin A, vitamin C, potassium, and folic acid. According to the [employer], including fruit and vegetables daily may reduce the risk of cardiovascular disease, certain cancers, and other non-communicable diseases Why physical activity is needed:  Physical activity has various benefits for the body such as reducing  stress, helping control blood glucose levels, strengthening the heart, reducing incidence of cognitive impairment, and has a neuroprotective effect on memory function. Research reports that physical activity (whether it be aerobic, resistance training, or high intensity interval training) help control the level of reactive oxygen species which can lead to oxidative stress on the brain therefor possibly impairing memory, learning abilities, and other cognitive functions. The CDC recognizes that physical activity can help prevent premature death, may prevent the development of type 2 diabetes, different cancers, and heart disease. Not only are there are health benefits to physical activity, the CDC also reports that physical activity would decrease health care costs, increase property values, and people who are physically active generally have less sick days. Resource https://www-sciencedirect-com.libproxy.MarathonSkier.co.uk, CDC Encouraged patient to honor their body's internal hunger and fullness cues.  Throughout the day, check in mentally and rate hunger. Stop eating when satisfied not full regardless of how much food is left on the plate.  Get more if still hungry 20-30 minutes later.  The key is to honor satisfaction so throughout the meal, rate fullness factor and stop when comfortably satisfied not physically full. The key is to honor hunger and fullness without any feelings of guilt or shame.  Pay attention to what the internal cues are, rather than any external factors. This will enhance the confidence you have in listening to your own body and following those internal cues enabling you to increase how often you eat when you are hungry not out of appetite and stop when you are satisfied not full.  Encouraged pt to continue to eat balanced meals inclusive of non starchy vegetables 2 times a day 7 days a week Encouraged pt to choose lean protein sources: limiting beef, pork,  sausage, hotdogs, and lunch meat Encourage pt to choose healthy fats such as plant based limiting animal fats Encouraged pt to continue to drink a minium 64 fluid ounces with half being plain water to satisfy proper hydration    Goals:   -for 45 minutes vigorous workouts 5 days a week and 2 of those days add in 15 minutes of resistance exercise this workout routine + an appropriate calorically dispersed diet may help to result in getting closer to your weight loss goal however keep in mind you have been extremely successful thus far   Handouts Provided Include    Learning Style & Readiness for Change Teaching method utilized: Visual & Auditory  Demonstrated degree of understanding via: Teach Back  Readiness Level: action Barriers to learning/adherence to lifestyle change: none identified  RD's Notes for Next Visit Assess adherence to pt chosen goals   MONITORING & EVALUATION Dietary intake, weekly physical activity, body weight  Next Steps Patient is to follow-up as needed

## 2021-04-24 DIAGNOSIS — Z9884 Bariatric surgery status: Secondary | ICD-10-CM | POA: Diagnosis not present

## 2021-04-24 DIAGNOSIS — Z09 Encounter for follow-up examination after completed treatment for conditions other than malignant neoplasm: Secondary | ICD-10-CM | POA: Diagnosis not present

## 2021-05-16 ENCOUNTER — Telehealth: Payer: Self-pay | Admitting: Skilled Nursing Facility1

## 2021-05-16 NOTE — Telephone Encounter (Signed)
There is research that indicates constipation will cause increased B12 lab values. While the research does not strongly suggest major issues with excess B12 you could take a multivitamin such as bariatric advantage ultra-solo because it has  instead of 1000 mcg like some of the other brands. Also consider if you are drinking any protein shakes this will have a fair amount of B12 as well. ? ?From: [name] [email or web address]>  ?Sent: Thursday, May 16, 2021 3:47 PM ?To: Anisah Kuck [email or web address]> ?Subject: RE: B12 ? ?*Caution - External email - see footer for warnings* ?Yes. There has been some constipation.  ?On Thu, May 16, 2021 at 3:40 PM, Jeanetta Alonzo, Jon Gills ?[email or web address]> wrote: ?Have you been constipated? Either not pooping at all or small round poops, or a couple of watery stools? ?  ?From: [name] [email or web address]>  ?Sent: Wednesday, May 15, 2021 11:51 AM ?To: Milo Schreier [email or web address]> ?Subject: B12 ?  ?*Caution - External email - see footer for warnings* ?Hello, ?  ?Just had yearly blood work done and my b12 is 2,000. The bariatric vitamins have a lot of B12. What should I do? My GP is looking into it too but I wanted to get your opinion.  ?  ?[name]  ? ?

## 2021-06-28 DIAGNOSIS — R635 Abnormal weight gain: Secondary | ICD-10-CM | POA: Diagnosis not present

## 2021-06-28 DIAGNOSIS — R5383 Other fatigue: Secondary | ICD-10-CM | POA: Diagnosis not present

## 2021-07-05 DIAGNOSIS — R5383 Other fatigue: Secondary | ICD-10-CM | POA: Diagnosis not present

## 2021-07-05 DIAGNOSIS — R635 Abnormal weight gain: Secondary | ICD-10-CM | POA: Diagnosis not present

## 2021-08-12 DIAGNOSIS — M9904 Segmental and somatic dysfunction of sacral region: Secondary | ICD-10-CM | POA: Diagnosis not present

## 2021-08-12 DIAGNOSIS — M9903 Segmental and somatic dysfunction of lumbar region: Secondary | ICD-10-CM | POA: Diagnosis not present

## 2021-08-12 DIAGNOSIS — M9901 Segmental and somatic dysfunction of cervical region: Secondary | ICD-10-CM | POA: Diagnosis not present

## 2021-08-12 DIAGNOSIS — M9902 Segmental and somatic dysfunction of thoracic region: Secondary | ICD-10-CM | POA: Diagnosis not present

## 2021-08-14 DIAGNOSIS — M9904 Segmental and somatic dysfunction of sacral region: Secondary | ICD-10-CM | POA: Diagnosis not present

## 2021-08-14 DIAGNOSIS — M9901 Segmental and somatic dysfunction of cervical region: Secondary | ICD-10-CM | POA: Diagnosis not present

## 2021-08-14 DIAGNOSIS — M9903 Segmental and somatic dysfunction of lumbar region: Secondary | ICD-10-CM | POA: Diagnosis not present

## 2021-08-14 DIAGNOSIS — M9902 Segmental and somatic dysfunction of thoracic region: Secondary | ICD-10-CM | POA: Diagnosis not present

## 2021-08-21 DIAGNOSIS — M9901 Segmental and somatic dysfunction of cervical region: Secondary | ICD-10-CM | POA: Diagnosis not present

## 2021-08-21 DIAGNOSIS — M9902 Segmental and somatic dysfunction of thoracic region: Secondary | ICD-10-CM | POA: Diagnosis not present

## 2021-08-21 DIAGNOSIS — M9904 Segmental and somatic dysfunction of sacral region: Secondary | ICD-10-CM | POA: Diagnosis not present

## 2021-08-21 DIAGNOSIS — M9903 Segmental and somatic dysfunction of lumbar region: Secondary | ICD-10-CM | POA: Diagnosis not present

## 2021-08-28 DIAGNOSIS — M9904 Segmental and somatic dysfunction of sacral region: Secondary | ICD-10-CM | POA: Diagnosis not present

## 2021-08-28 DIAGNOSIS — M9903 Segmental and somatic dysfunction of lumbar region: Secondary | ICD-10-CM | POA: Diagnosis not present

## 2021-08-28 DIAGNOSIS — M9901 Segmental and somatic dysfunction of cervical region: Secondary | ICD-10-CM | POA: Diagnosis not present

## 2021-08-28 DIAGNOSIS — M9902 Segmental and somatic dysfunction of thoracic region: Secondary | ICD-10-CM | POA: Diagnosis not present

## 2021-08-30 DIAGNOSIS — E039 Hypothyroidism, unspecified: Secondary | ICD-10-CM | POA: Diagnosis not present

## 2021-09-19 DIAGNOSIS — M9903 Segmental and somatic dysfunction of lumbar region: Secondary | ICD-10-CM | POA: Diagnosis not present

## 2021-09-19 DIAGNOSIS — M9902 Segmental and somatic dysfunction of thoracic region: Secondary | ICD-10-CM | POA: Diagnosis not present

## 2021-09-19 DIAGNOSIS — M9904 Segmental and somatic dysfunction of sacral region: Secondary | ICD-10-CM | POA: Diagnosis not present

## 2021-09-19 DIAGNOSIS — M9901 Segmental and somatic dysfunction of cervical region: Secondary | ICD-10-CM | POA: Diagnosis not present

## 2021-11-22 DIAGNOSIS — M9901 Segmental and somatic dysfunction of cervical region: Secondary | ICD-10-CM | POA: Diagnosis not present

## 2021-11-22 DIAGNOSIS — M9902 Segmental and somatic dysfunction of thoracic region: Secondary | ICD-10-CM | POA: Diagnosis not present

## 2021-11-22 DIAGNOSIS — M9904 Segmental and somatic dysfunction of sacral region: Secondary | ICD-10-CM | POA: Diagnosis not present

## 2021-11-22 DIAGNOSIS — M9903 Segmental and somatic dysfunction of lumbar region: Secondary | ICD-10-CM | POA: Diagnosis not present

## 2021-12-20 DIAGNOSIS — N951 Menopausal and female climacteric states: Secondary | ICD-10-CM | POA: Diagnosis not present

## 2021-12-20 DIAGNOSIS — R5383 Other fatigue: Secondary | ICD-10-CM | POA: Diagnosis not present

## 2022-01-24 DIAGNOSIS — M9903 Segmental and somatic dysfunction of lumbar region: Secondary | ICD-10-CM | POA: Diagnosis not present

## 2022-01-24 DIAGNOSIS — M9902 Segmental and somatic dysfunction of thoracic region: Secondary | ICD-10-CM | POA: Diagnosis not present

## 2022-01-24 DIAGNOSIS — M9901 Segmental and somatic dysfunction of cervical region: Secondary | ICD-10-CM | POA: Diagnosis not present

## 2022-01-24 DIAGNOSIS — M9904 Segmental and somatic dysfunction of sacral region: Secondary | ICD-10-CM | POA: Diagnosis not present

## 2022-01-28 DIAGNOSIS — M9903 Segmental and somatic dysfunction of lumbar region: Secondary | ICD-10-CM | POA: Diagnosis not present

## 2022-01-28 DIAGNOSIS — M9902 Segmental and somatic dysfunction of thoracic region: Secondary | ICD-10-CM | POA: Diagnosis not present

## 2022-01-28 DIAGNOSIS — M9901 Segmental and somatic dysfunction of cervical region: Secondary | ICD-10-CM | POA: Diagnosis not present

## 2022-01-28 DIAGNOSIS — M9904 Segmental and somatic dysfunction of sacral region: Secondary | ICD-10-CM | POA: Diagnosis not present

## 2022-02-04 DIAGNOSIS — M9901 Segmental and somatic dysfunction of cervical region: Secondary | ICD-10-CM | POA: Diagnosis not present

## 2022-02-04 DIAGNOSIS — M9904 Segmental and somatic dysfunction of sacral region: Secondary | ICD-10-CM | POA: Diagnosis not present

## 2022-02-04 DIAGNOSIS — M9903 Segmental and somatic dysfunction of lumbar region: Secondary | ICD-10-CM | POA: Diagnosis not present

## 2022-02-04 DIAGNOSIS — M9902 Segmental and somatic dysfunction of thoracic region: Secondary | ICD-10-CM | POA: Diagnosis not present

## 2022-02-13 DIAGNOSIS — M9903 Segmental and somatic dysfunction of lumbar region: Secondary | ICD-10-CM | POA: Diagnosis not present

## 2022-02-13 DIAGNOSIS — M9902 Segmental and somatic dysfunction of thoracic region: Secondary | ICD-10-CM | POA: Diagnosis not present

## 2022-02-13 DIAGNOSIS — M9904 Segmental and somatic dysfunction of sacral region: Secondary | ICD-10-CM | POA: Diagnosis not present

## 2022-02-13 DIAGNOSIS — M9901 Segmental and somatic dysfunction of cervical region: Secondary | ICD-10-CM | POA: Diagnosis not present

## 2022-02-18 DIAGNOSIS — M9904 Segmental and somatic dysfunction of sacral region: Secondary | ICD-10-CM | POA: Diagnosis not present

## 2022-02-18 DIAGNOSIS — M9902 Segmental and somatic dysfunction of thoracic region: Secondary | ICD-10-CM | POA: Diagnosis not present

## 2022-02-18 DIAGNOSIS — M9901 Segmental and somatic dysfunction of cervical region: Secondary | ICD-10-CM | POA: Diagnosis not present

## 2022-02-18 DIAGNOSIS — M9903 Segmental and somatic dysfunction of lumbar region: Secondary | ICD-10-CM | POA: Diagnosis not present

## 2022-02-27 DIAGNOSIS — M9904 Segmental and somatic dysfunction of sacral region: Secondary | ICD-10-CM | POA: Diagnosis not present

## 2022-02-27 DIAGNOSIS — M9901 Segmental and somatic dysfunction of cervical region: Secondary | ICD-10-CM | POA: Diagnosis not present

## 2022-02-27 DIAGNOSIS — M9902 Segmental and somatic dysfunction of thoracic region: Secondary | ICD-10-CM | POA: Diagnosis not present

## 2022-02-27 DIAGNOSIS — M9903 Segmental and somatic dysfunction of lumbar region: Secondary | ICD-10-CM | POA: Diagnosis not present

## 2022-02-28 DIAGNOSIS — H524 Presbyopia: Secondary | ICD-10-CM | POA: Diagnosis not present

## 2022-02-28 DIAGNOSIS — H52223 Regular astigmatism, bilateral: Secondary | ICD-10-CM | POA: Diagnosis not present

## 2022-02-28 DIAGNOSIS — H5213 Myopia, bilateral: Secondary | ICD-10-CM | POA: Diagnosis not present

## 2022-03-06 DIAGNOSIS — M9901 Segmental and somatic dysfunction of cervical region: Secondary | ICD-10-CM | POA: Diagnosis not present

## 2022-03-06 DIAGNOSIS — M9902 Segmental and somatic dysfunction of thoracic region: Secondary | ICD-10-CM | POA: Diagnosis not present

## 2022-03-06 DIAGNOSIS — M9904 Segmental and somatic dysfunction of sacral region: Secondary | ICD-10-CM | POA: Diagnosis not present

## 2022-03-06 DIAGNOSIS — M9903 Segmental and somatic dysfunction of lumbar region: Secondary | ICD-10-CM | POA: Diagnosis not present

## 2022-03-11 DIAGNOSIS — M9901 Segmental and somatic dysfunction of cervical region: Secondary | ICD-10-CM | POA: Diagnosis not present

## 2022-03-11 DIAGNOSIS — M9904 Segmental and somatic dysfunction of sacral region: Secondary | ICD-10-CM | POA: Diagnosis not present

## 2022-03-11 DIAGNOSIS — M9903 Segmental and somatic dysfunction of lumbar region: Secondary | ICD-10-CM | POA: Diagnosis not present

## 2022-03-11 DIAGNOSIS — M9902 Segmental and somatic dysfunction of thoracic region: Secondary | ICD-10-CM | POA: Diagnosis not present

## 2022-03-24 DIAGNOSIS — M9902 Segmental and somatic dysfunction of thoracic region: Secondary | ICD-10-CM | POA: Diagnosis not present

## 2022-03-24 DIAGNOSIS — M9901 Segmental and somatic dysfunction of cervical region: Secondary | ICD-10-CM | POA: Diagnosis not present

## 2022-03-24 DIAGNOSIS — M9904 Segmental and somatic dysfunction of sacral region: Secondary | ICD-10-CM | POA: Diagnosis not present

## 2022-03-24 DIAGNOSIS — M9903 Segmental and somatic dysfunction of lumbar region: Secondary | ICD-10-CM | POA: Diagnosis not present

## 2022-04-07 DIAGNOSIS — Z8639 Personal history of other endocrine, nutritional and metabolic disease: Secondary | ICD-10-CM | POA: Diagnosis not present

## 2022-04-07 LAB — HM MAMMOGRAPHY

## 2022-04-10 ENCOUNTER — Other Ambulatory Visit: Payer: Self-pay | Admitting: Obstetrics and Gynecology

## 2022-04-10 DIAGNOSIS — R928 Other abnormal and inconclusive findings on diagnostic imaging of breast: Secondary | ICD-10-CM

## 2022-04-15 ENCOUNTER — Other Ambulatory Visit: Payer: No Typology Code available for payment source

## 2022-04-24 ENCOUNTER — Ambulatory Visit
Admission: RE | Admit: 2022-04-24 | Discharge: 2022-04-24 | Disposition: A | Discharge status: Home / Self Care | Payer: 59 | Source: Ambulatory Visit | Attending: Obstetrics and Gynecology | Admitting: Obstetrics and Gynecology

## 2022-04-24 ENCOUNTER — Ambulatory Visit: Admission: RE | Admit: 2022-04-24 | Payer: No Typology Code available for payment source | Source: Ambulatory Visit

## 2022-04-24 DIAGNOSIS — R928 Other abnormal and inconclusive findings on diagnostic imaging of breast: Secondary | ICD-10-CM | POA: Diagnosis not present

## 2022-04-28 DIAGNOSIS — M9904 Segmental and somatic dysfunction of sacral region: Secondary | ICD-10-CM | POA: Diagnosis not present

## 2022-04-28 DIAGNOSIS — M9903 Segmental and somatic dysfunction of lumbar region: Secondary | ICD-10-CM | POA: Diagnosis not present

## 2022-04-28 DIAGNOSIS — M9901 Segmental and somatic dysfunction of cervical region: Secondary | ICD-10-CM | POA: Diagnosis not present

## 2022-04-28 DIAGNOSIS — M9902 Segmental and somatic dysfunction of thoracic region: Secondary | ICD-10-CM | POA: Diagnosis not present

## 2022-05-01 IMAGING — RF DG UGI W SINGLE CM
10 of 15 series · 12 of 17 positions shown · non-contrast
Comparison: None.

CLINICAL DATA: Preop bariatric surgery

EXAM:
UPPER GI SERIES WITH KUB
TECHNIQUE: After obtaining a scout radiograph a routine upper GI series was
performed using thin barium
FLUOROSCOPY TIME:  Fluoroscopy Time:  1 minutes 6 seconds
Radiation Exposure Index (if provided by the fluoroscopic device):
51.9 mGy
Number of Acquired Spot Images: 0

[Series 1: t abdomen supine · 0.15mm/px · 1 of 1 slices shown]
[im 1/1]
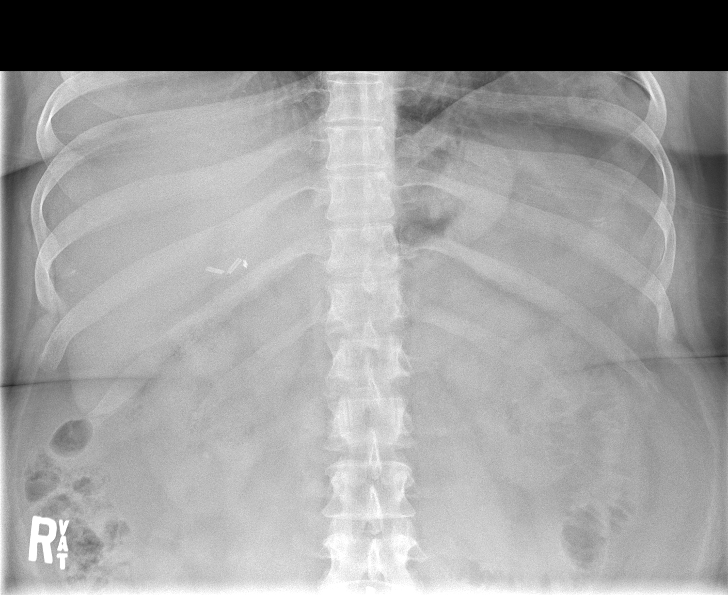

[Series 3: cp_standard · 0.51mm/px · 3 of 65 frames shown (1 of 9)]
[frame 10/65]
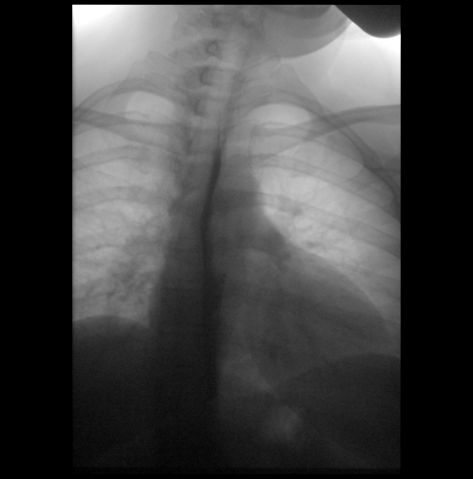
[frame 33/65]
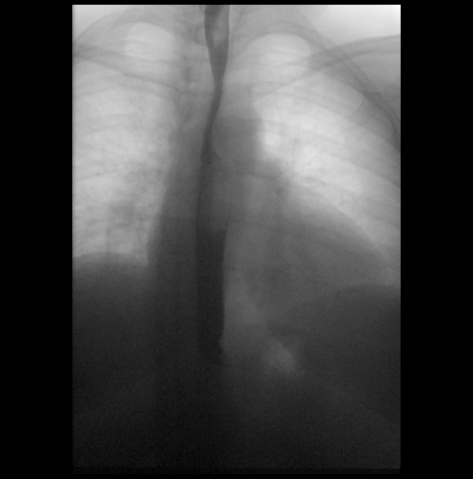
[frame 56/65]
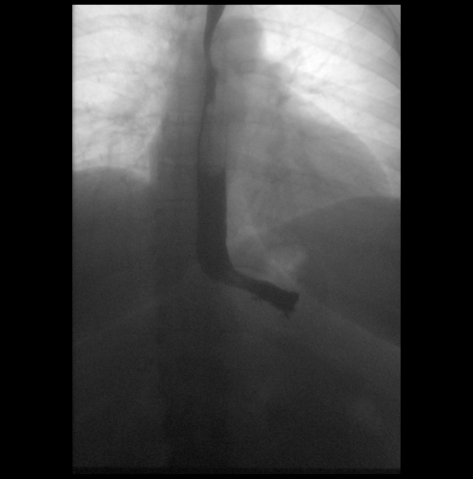

[Series 5: cp_standard · 0.17mm/px · 1 of 1 slices shown (2 of 9)]
[im 1/1]
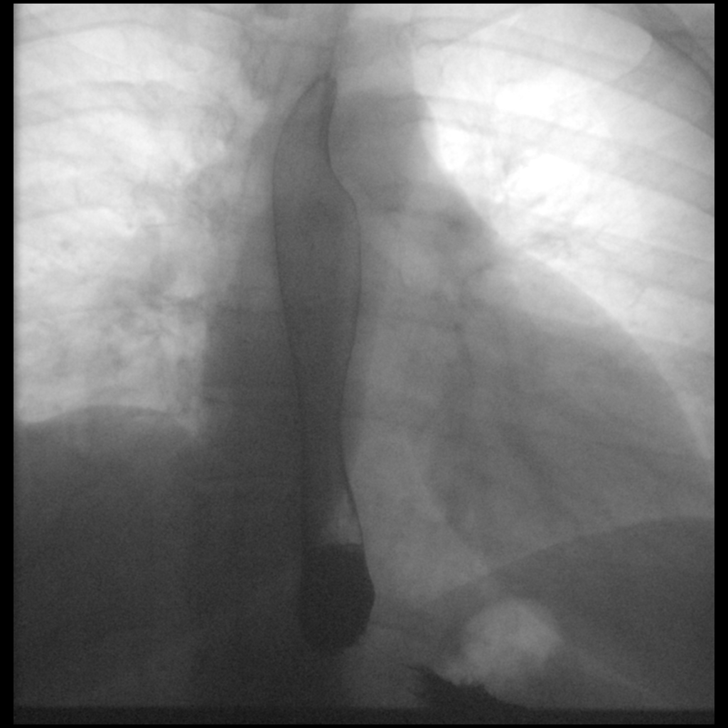

[Series 6: cp_standard · 0.17mm/px · 1 of 1 slices shown (3 of 9)]
[im 1/1]
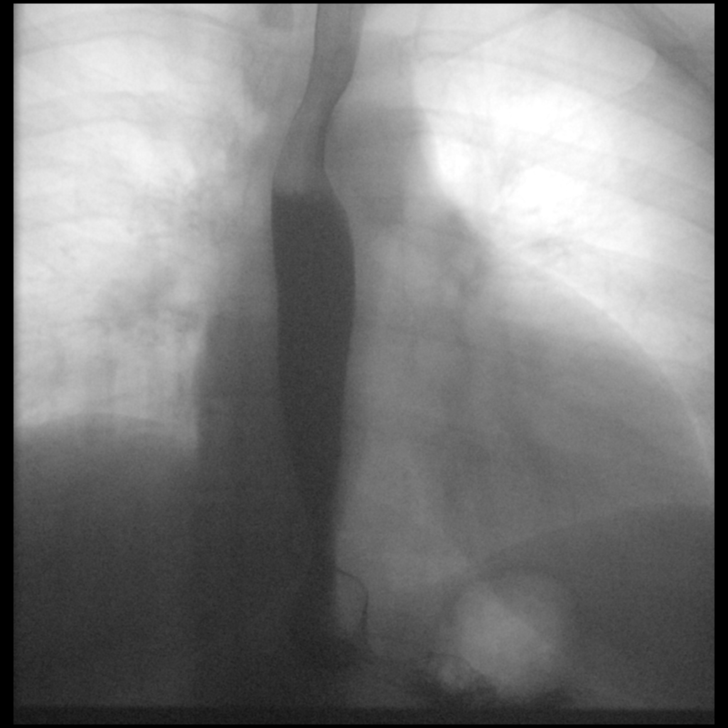

[Series 8: cp_standard · 0.17mm/px · 1 of 1 slices shown (4 of 9)]
[im 1/1]
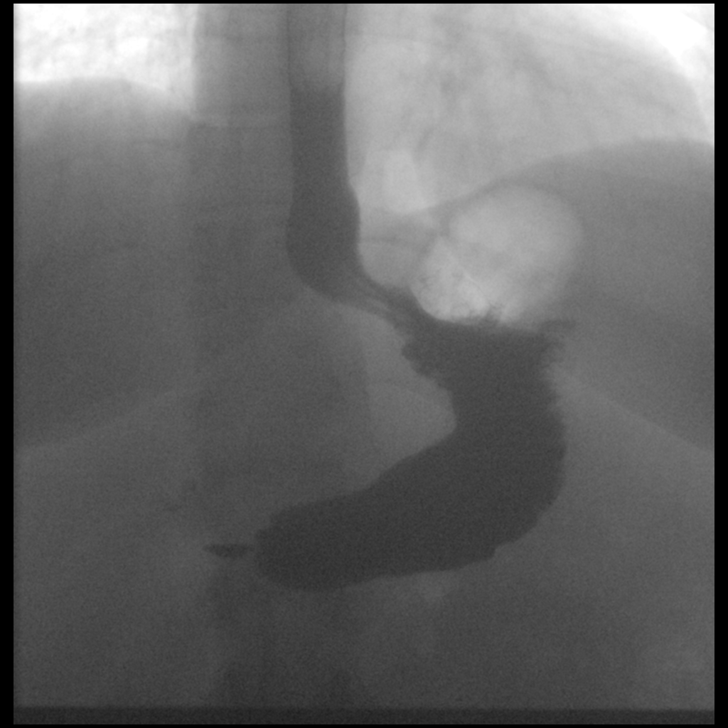

[Series 9: cp_standard · 0.17mm/px · 1 of 1 slices shown (5 of 9)]
[im 1/1]
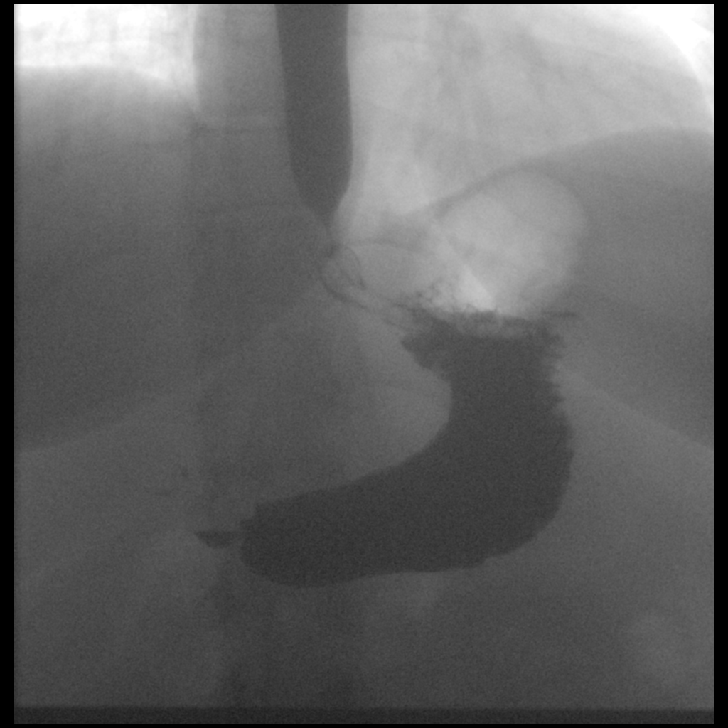

[Series 11: cp_standard · 0.17mm/px · 1 of 1 slices shown (6 of 9)]
[im 1/1]
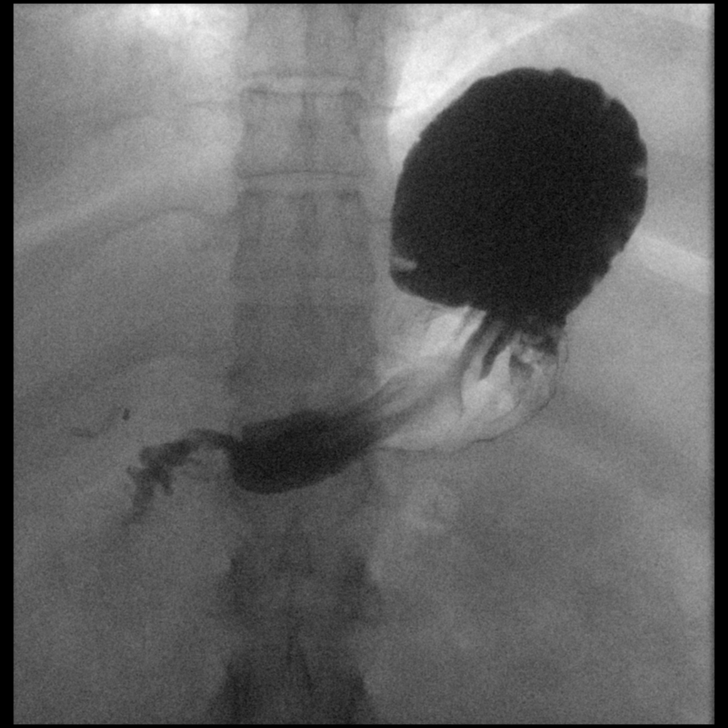

[Series 12: cp_standard · 0.17mm/px · 1 of 1 slices shown (7 of 9)]
[im 1/1]
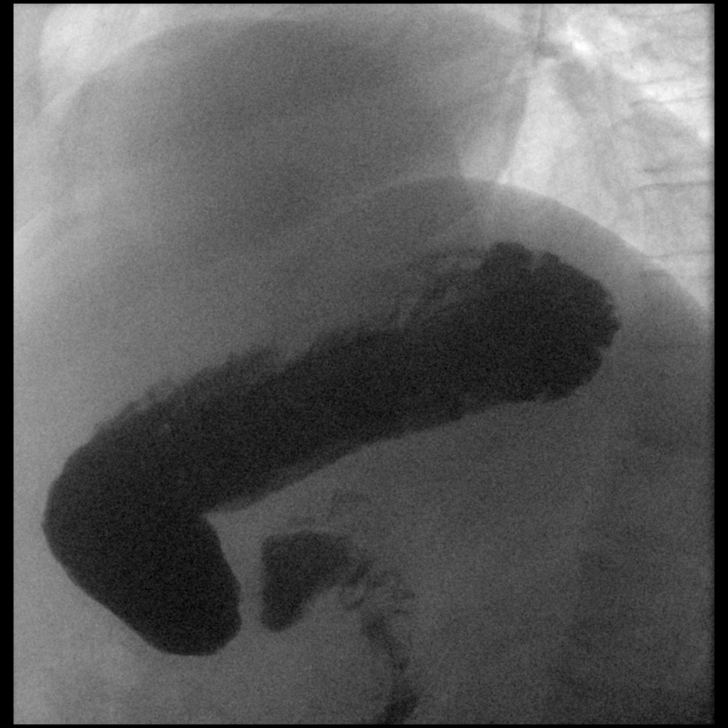

[Series 13: cp_standard · 0.17mm/px · 1 of 1 slices shown (8 of 9)]
[im 1/1]
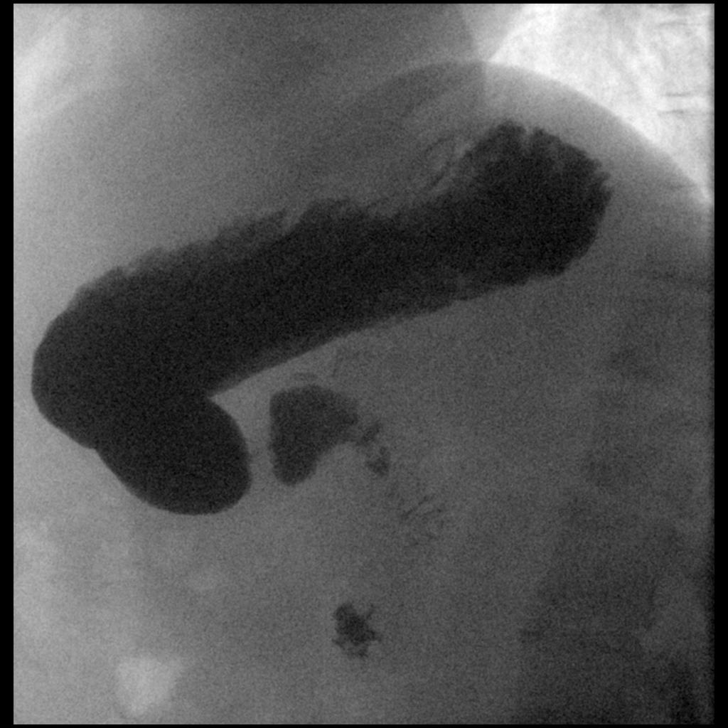

[Series 15: cp_standard · 0.17mm/px · 1 of 1 slices shown (9 of 9)]
[im 1/1]
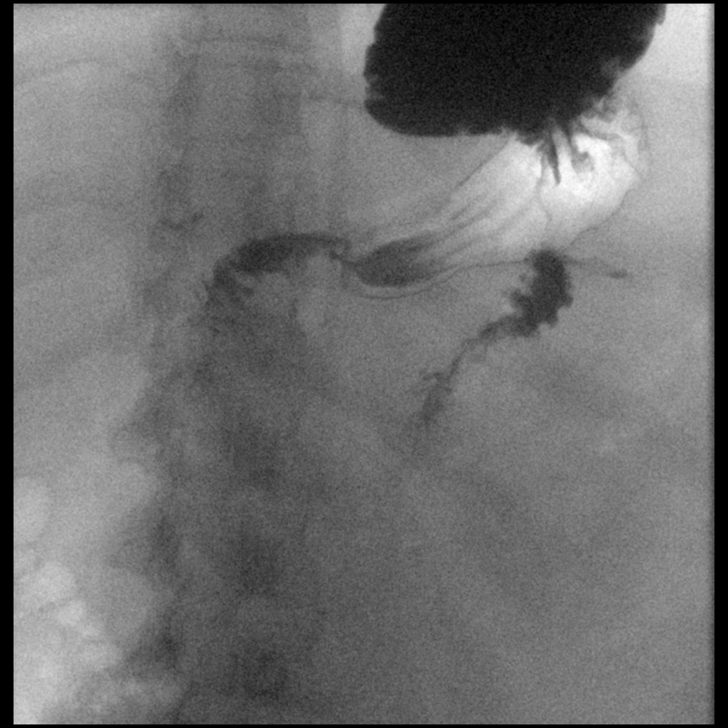

[12 of 17 positions shown; findings below may reference images not displayed]

FINDINGS: The esophagus is patent. No stricture or mass identified. The
motility of the esophagus is unremarkable.

A small hiatal hernia is suspected. The stomach is otherwise normal
in configuration. The duodenal bulb and duodenal C loop are
unremarkable. No reflux identified.
IMPRESSION: 1. Suspect small hiatal hernia.
2. Examination is otherwise unremarkable.

## 2022-05-12 DIAGNOSIS — M9901 Segmental and somatic dysfunction of cervical region: Secondary | ICD-10-CM | POA: Diagnosis not present

## 2022-05-12 DIAGNOSIS — M9903 Segmental and somatic dysfunction of lumbar region: Secondary | ICD-10-CM | POA: Diagnosis not present

## 2022-05-12 DIAGNOSIS — M9904 Segmental and somatic dysfunction of sacral region: Secondary | ICD-10-CM | POA: Diagnosis not present

## 2022-05-12 DIAGNOSIS — M9902 Segmental and somatic dysfunction of thoracic region: Secondary | ICD-10-CM | POA: Diagnosis not present

## 2022-05-26 DIAGNOSIS — M9903 Segmental and somatic dysfunction of lumbar region: Secondary | ICD-10-CM | POA: Diagnosis not present

## 2022-05-26 DIAGNOSIS — M9902 Segmental and somatic dysfunction of thoracic region: Secondary | ICD-10-CM | POA: Diagnosis not present

## 2022-05-26 DIAGNOSIS — M9901 Segmental and somatic dysfunction of cervical region: Secondary | ICD-10-CM | POA: Diagnosis not present

## 2022-05-26 DIAGNOSIS — M9904 Segmental and somatic dysfunction of sacral region: Secondary | ICD-10-CM | POA: Diagnosis not present

## 2022-05-27 NOTE — Progress Notes (Addendum)
[facility] at [facility] [address] [place], [place] [address] [phone]  Date:  05/29/2022   Name:  [name]   DOB:  1980-06-06   MRN:  [id number]  PCP:  Darreld Mclean, MD    Chief Complaint: re establish (Concerns/ questions: 1. Discuss weight R eat pain. 2. Rash on ring finger/Tdap: none in last 10 years/Flu: none this season)   History of Present Illness:  [name] is a 42 y.o. very pleasant female patient who presents with the following:  Patient seen today to discuss weight loss Most recent visit with myself was in 2019, I have actually only seen her twice previously  History of obesity, hyperlipidemia, vitamin D deficiency Husband is s/p vasectomy Her children are 74- daughter and 24- son   She had gastric sleeve in 2021 She has lost 65-70 lbs; she would like to lose more and wonders what might help her  She is most interested in zepbound or another GLP-1 drug.  She believes her insurance will cover these with a prior authorization  No contra to GLP-1 She did take one in the past and tolerated it ok- just a sample   Most recent labs about a year ago except she had a TSH and T4 recently which were normal  She sees Bobbye Charleston for GYN  She works for [employer] at [employer] per GYN   She notes a possible issue with her right ear -she feels like the ear canal sides might be sticking together with wax on occasion.  She recently used an over-the-counter earwax removal kit.  Also a rash on her left ring finger just under her ring- these are not new rings  Declines Tetanus booster today - I did remind her that she is due and should have a booster esp if a dirty wound  Wt Readings from Last 3 Encounters:  05/29/22 229 lb 12.8 oz (104.2 kg)  04/18/21 220 lb 8 oz (100 kg)  01/16/21 226 lb 4.8 oz (102.6 kg)      Patient Active Problem List   Diagnosis Date Noted   Cervical  radiculopathy 11/29/2020   Morbid obesity ([place]) 04/10/2020   Plantar fasciitis 07/09/2017   BMI 45.0-49.9, adult ([place]) 06/24/2012   Anxiety and depression 06/24/2012   History of nephrolithiasis 06/24/2012   Hyperlipidemia 06/24/2012   Vitamin D deficiency 05/07/2011    Past Medical History:  Diagnosis Date   Anxiety    Depression    GERD (gastroesophageal reflux disease)    History of hiatal hernia    History of kidney stones    Kidney stones    PONV (postoperative nausea and vomiting)     Past Surgical History:  Procedure Laterality Date   BREAST SURGERY     reduction   CESAREAN SECTION  08/2007   x2   CHOLECYSTECTOMY     EYE SURGERY     as a child   LAPAROSCOPIC GASTRIC SLEEVE RESECTION N/A 04/10/2020   Procedure: LAPAROSCOPIC GASTRIC SLEEVE RESECTION;  Surgeon: Clovis Riley, MD;  Location: [facility];  Service: General;  Laterality: N/A;   UPPER GI ENDOSCOPY N/A 04/10/2020   Procedure: UPPER GI ENDOSCOPY;  Surgeon: Clovis Riley, MD;  Location: [facility];  Service: General;  Laterality: N/A;    Social History   Tobacco Use   Smoking status: Former    Packs/day: 0.25    Years:  5.00    Additional pack years: 0.00    Total pack years: 1.25    Types: Cigarettes    Quit date: 2005    Years since quitting: 19.2   Smokeless tobacco: Never  Vaping Use   Vaping Use: Never used  Substance Use Topics   Alcohol use: Not Currently   Drug use: No    Family History  Problem Relation Age of Onset   Diabetes Father    Kidney disease Father    Heart disease Father    Diabetes Paternal Grandmother    Cervical cancer Sister     No Known Allergies  Medication list has been reviewed and updated.  Current Outpatient Medications on File Prior to Visit  Medication Sig Dispense Refill   CRANBERRY PO Take 250 mg by mouth daily.     docusate sodium (COLACE) 100 MG capsule Take 1 capsule (100 mg total) by mouth 2 (two) times daily. 10 capsule 0   FLUoxetine (PROZAC) 40 MG  capsule Take 40 mg by mouth daily.     pantoprazole (PROTONIX) 40 MG tablet Take 1 tablet (40 mg total) by mouth daily. 90 tablet 0   No current facility-administered medications on file prior to visit.    Review of Systems:  As per HPI- otherwise negative.  Pt notes a recent TSH check  Physical Examination: Vitals:   05/29/22 1300  BP: 126/80  Pulse: 78  Resp: 18  Temp: 97.6 F (36.4 C)  SpO2: 98%   Vitals:   05/29/22 1300  Weight: 229 lb 12.8 oz (104.2 kg)  Height: 5\' 5"  (1.651 m)   Body mass index is 38.24 kg/m. Ideal Body Weight: Weight in (lb) to have BMI = 25: 149.9  GEN: no acute distress.  Obese, looks well  HEENT: Atraumatic, Normocephalic.  Bilateral TM wnl, oropharynx normal.  PEERL,EOMI.   Ears and Nose: No external deformity. Advised both ears appear normal  CV: RRR, No M/G/R. No JVD. No thrill. No extra heart sounds. PULM: CTA B, no wheezes, crackles, rhonchi. No retractions. No resp. distress. No accessory muscle use. ABD: S, NT, ND, +BS. No rebound. No HSM. EXTR: No c/c/e PSYCH: Normally interactive. Conversant.  No current rash on her left ring finger- it sounds like she is either having a reaction to the metal or to moisture We discussed how to manage this   Assessment and Plan: Morbid obesity ([place]) - Plan: tirzepatide (ZEPBOUND) 2.5 MG/0.5ML Pen  Screening for diabetes mellitus - Plan: Comprehensive metabolic panel, Hemoglobin A1c  Screening, lipid - Plan: Lipid panel  Screening for deficiency anemia - Plan: CBC  Weight gain - Plan: Cortisol  Patient seen today to reestablish care.  She also would like to discuss weight loss.  She did have gastric sleeve 2 years ago and has made significant progress but BMI is still 38.  She would like to lose more weight for her health and to prevent musculoskeletal problems.  I prescribed zepbound, started 2.5 mg.  We can increase this monthly as needed. Other lab work is pending as above Recent normal TSH  per her GYN provider Signed Lamar Blinks, MD  Received labs 3/22- message to pt  Results for orders placed or performed in visit on 05/29/22  CBC  Result Value Ref Range   WBC 11.7 (H) 4.0 - 10.5 K/uL   RBC 4.14 3.87 - 5.11 Mil/uL   Platelets 327.0 150.0 - 400.0 K/uL   Hemoglobin 10.8 (L) 12.0 - 15.0 g/dL  HCT 33.2 (L) 36.0 - 46.0 %   MCV 80.2 78.0 - 100.0 fl   MCHC 32.6 30.0 - 36.0 g/dL   RDW 13.1 11.5 - 15.5 %  Comprehensive metabolic panel  Result Value Ref Range   Sodium 139 135 - 145 mEq/L   Potassium 4.1 3.5 - 5.1 mEq/L   Chloride 105 96 - 112 mEq/L   CO2 26 19 - 32 mEq/L   Glucose, Bld 83 70 - 99 mg/dL   BUN 22 6 - 23 mg/dL   Creatinine, Ser 0.85 0.40 - 1.20 mg/dL   Total Bilirubin 0.2 0.2 - 1.2 mg/dL   Alkaline Phosphatase 51 39 - 117 U/L   AST 12 0 - 37 U/L   ALT 11 0 - 35 U/L   Total Protein 6.4 6.0 - 8.3 g/dL   Albumin 4.0 3.5 - 5.2 g/dL   GFR 84.08 >60.00 mL/min   Calcium 9.1 8.4 - 10.5 mg/dL  Hemoglobin A1c  Result Value Ref Range   Hgb A1c MFr Bld 5.4 4.6 - 6.5 %  Lipid panel  Result Value Ref Range   Cholesterol 230 (H) 0 - 200 mg/dL   Triglycerides (H) 0.0 - 149.0 mg/dL    423.0 Triglyceride is over 400; calculations on Lipids are invalid.   HDL 44.40 >39.00 mg/dL   Total CHOL/HDL Ratio 5   Cortisol  Result Value Ref Range   Cortisol, Plasma 7.1 ug/dL  LDL cholesterol, direct  Result Value Ref Range   Direct LDL 140.0 mg/dL

## 2022-05-29 ENCOUNTER — Ambulatory Visit (INDEPENDENT_AMBULATORY_CARE_PROVIDER_SITE_OTHER): Payer: 59 | Admitting: Family Medicine

## 2022-05-29 ENCOUNTER — Encounter: Payer: Self-pay | Admitting: Family Medicine

## 2022-05-29 ENCOUNTER — Other Ambulatory Visit: Payer: Self-pay | Admitting: Family Medicine

## 2022-05-29 DIAGNOSIS — Z0000 Encounter for general adult medical examination without abnormal findings: Secondary | ICD-10-CM

## 2022-05-29 DIAGNOSIS — Z13220 Encounter for screening for lipoid disorders: Secondary | ICD-10-CM

## 2022-05-29 DIAGNOSIS — R635 Abnormal weight gain: Secondary | ICD-10-CM | POA: Diagnosis not present

## 2022-05-29 DIAGNOSIS — Z131 Encounter for screening for diabetes mellitus: Secondary | ICD-10-CM

## 2022-05-29 DIAGNOSIS — Z130 Encounter for screening for diseases of the blood and blood-forming organs and certain disorders involving the immune mechanism: Secondary | ICD-10-CM | POA: Diagnosis not present

## 2022-05-29 MED ORDER — ZEPBOUND 2.5 MG/0.5ML ~~LOC~~ SOAJ: 2.5000 mg | SUBCUTANEOUS | 1 refills | Status: DC

## 2022-05-29 MED ORDER — WEGOVY 0.25 MG/0.5ML ~~LOC~~ SOAJ: 0.2500 mg | SUBCUTANEOUS | 0 refills | Status: DC

## 2022-05-29 NOTE — Patient Instructions (Addendum)
It was good to see you today- I will be in touch with your labs asap  If your insurance will cover we can start a GLP-1 agonist for weight loss Zepbound, Wegovy, Saxenda-please check with your insurance and let me know what you find out   Start with 2.5 mg weekly- we can go up as needed and tolerated

## 2022-05-29 NOTE — Addendum Note (Signed)
Addended by: Darreld Mclean on: 05/29/2022 04:47 PM   Modules accepted: Orders

## 2022-05-30 ENCOUNTER — Encounter: Payer: Self-pay | Admitting: Family Medicine

## 2022-05-30 LAB — COMPREHENSIVE METABOLIC PANEL
ALT: 11 U/L (ref 0–35)
AST: 12 U/L (ref 0–37)
Albumin: 4 g/dL (ref 3.5–5.2)
Alkaline Phosphatase: 51 U/L (ref 39–117)
BUN: 22 mg/dL (ref 6–23)
CO2: 26 mEq/L (ref 19–32)
Calcium: 9.1 mg/dL (ref 8.4–10.5)
Chloride: 105 mEq/L (ref 96–112)
Creatinine, Ser: 0.85 mg/dL (ref 0.40–1.20)
GFR: 84.08 mL/min (ref >60.00)
Glucose, Bld: 83 mg/dL (ref 70–99)
Potassium: 4.1 mEq/L (ref 3.5–5.1)
Sodium: 139 mEq/L (ref 135–145)
Total Bilirubin: 0.2 mg/dL (ref 0.2–1.2)
Total Protein: 6.4 g/dL (ref 6.0–8.3)

## 2022-05-30 LAB — LDL CHOLESTEROL, DIRECT: Direct LDL: 140 mg/dL

## 2022-05-30 LAB — LIPID PANEL
Cholesterol: 230 mg/dL — ABNORMAL HIGH (ref 0–200)
HDL: 44.4 mg/dL (ref >39.00)
Total CHOL/HDL Ratio: 5
Triglycerides: 423 mg/dL — ABNORMAL HIGH (ref 0.0–149.0)

## 2022-05-30 LAB — CBC
HCT: 33.2 % — ABNORMAL LOW (ref 36.0–46.0)
Hemoglobin: 10.8 g/dL — ABNORMAL LOW (ref 12.0–15.0)
MCHC: 32.6 g/dL (ref 30.0–36.0)
MCV: 80.2 fl (ref 78.0–100.0)
Platelets: 327 10*3/uL (ref 150.0–400.0)
RBC: 4.14 Mil/uL (ref 3.87–5.11)
RDW: 13.1 % (ref 11.5–15.5)
WBC: 11.7 10*3/uL — ABNORMAL HIGH (ref 4.0–10.5)

## 2022-05-30 LAB — HEMOGLOBIN A1C: Hgb A1c MFr Bld: 5.4 % (ref 4.6–6.5)

## 2022-05-30 LAB — CORTISOL: Cortisol, Plasma: 7.1 ug/dL

## 2022-06-05 ENCOUNTER — Telehealth: Payer: Self-pay

## 2022-06-05 NOTE — Telephone Encounter (Signed)
PA initiated via Covermymeds; KEY: [id number]. Awaiting determination.

## 2022-06-09 ENCOUNTER — Telehealth: Payer: Self-pay | Admitting: Family Medicine

## 2022-06-09 ENCOUNTER — Other Ambulatory Visit: Payer: Self-pay

## 2022-06-09 DIAGNOSIS — R635 Abnormal weight gain: Secondary | ICD-10-CM

## 2022-06-09 MED ORDER — WEGOVY 0.25 MG/0.5ML ~~LOC~~ SOAJ: 0.2500 mg | SUBCUTANEOUS | 0 refills | Status: DC

## 2022-06-09 NOTE — Telephone Encounter (Signed)
Pt wanted to switch to [employer] for her wegovy.

## 2022-06-09 NOTE — Telephone Encounter (Signed)
PA was approved and Rx was sent to mail order.

## 2022-06-09 NOTE — Telephone Encounter (Signed)
PA approved.   Your PA request has been approved. Additional information will be provided in the approval communication. (Message 1145). Authorization Expiration Date: January 06, 2023.

## 2022-06-10 ENCOUNTER — Encounter: Payer: Self-pay | Admitting: Family Medicine

## 2022-06-10 DIAGNOSIS — M9902 Segmental and somatic dysfunction of thoracic region: Secondary | ICD-10-CM | POA: Diagnosis not present

## 2022-06-10 DIAGNOSIS — M9904 Segmental and somatic dysfunction of sacral region: Secondary | ICD-10-CM | POA: Diagnosis not present

## 2022-06-10 DIAGNOSIS — M9903 Segmental and somatic dysfunction of lumbar region: Secondary | ICD-10-CM | POA: Diagnosis not present

## 2022-06-10 DIAGNOSIS — M9901 Segmental and somatic dysfunction of cervical region: Secondary | ICD-10-CM | POA: Diagnosis not present

## 2022-06-10 NOTE — Telephone Encounter (Signed)
Looks like pt was switched to [employer]. Please advise if PA is still needed for Zepbound

## 2022-06-10 NOTE — Telephone Encounter (Signed)
[name],  Dr. [name] is not at our office and is not part on the Copland Pool.

## 2022-06-11 ENCOUNTER — Other Ambulatory Visit: Payer: Self-pay | Admitting: Family Medicine

## 2022-06-11 DIAGNOSIS — R635 Abnormal weight gain: Secondary | ICD-10-CM

## 2022-06-17 DIAGNOSIS — M9903 Segmental and somatic dysfunction of lumbar region: Secondary | ICD-10-CM | POA: Diagnosis not present

## 2022-06-17 DIAGNOSIS — M9904 Segmental and somatic dysfunction of sacral region: Secondary | ICD-10-CM | POA: Diagnosis not present

## 2022-06-17 DIAGNOSIS — M9901 Segmental and somatic dysfunction of cervical region: Secondary | ICD-10-CM | POA: Diagnosis not present

## 2022-06-17 DIAGNOSIS — M9902 Segmental and somatic dysfunction of thoracic region: Secondary | ICD-10-CM | POA: Diagnosis not present

## 2022-06-23 ENCOUNTER — Encounter: Payer: Self-pay | Admitting: *Deleted

## 2022-06-23 MED ORDER — WEGOVY 0.5 MG/0.5ML ~~LOC~~ SOAJ: 0.5000 mg | SUBCUTANEOUS | 0 refills | Status: DC

## 2022-06-23 NOTE — Addendum Note (Signed)
Addended byConrad [place] D on: 06/23/2022 01:03 PM   Modules accepted: Orders

## 2022-06-25 DIAGNOSIS — M9903 Segmental and somatic dysfunction of lumbar region: Secondary | ICD-10-CM | POA: Diagnosis not present

## 2022-06-25 DIAGNOSIS — M9901 Segmental and somatic dysfunction of cervical region: Secondary | ICD-10-CM | POA: Diagnosis not present

## 2022-06-25 DIAGNOSIS — M9902 Segmental and somatic dysfunction of thoracic region: Secondary | ICD-10-CM | POA: Diagnosis not present

## 2022-06-25 DIAGNOSIS — M9904 Segmental and somatic dysfunction of sacral region: Secondary | ICD-10-CM | POA: Diagnosis not present

## 2022-07-01 DIAGNOSIS — M9904 Segmental and somatic dysfunction of sacral region: Secondary | ICD-10-CM | POA: Diagnosis not present

## 2022-07-01 DIAGNOSIS — M9902 Segmental and somatic dysfunction of thoracic region: Secondary | ICD-10-CM | POA: Diagnosis not present

## 2022-07-01 DIAGNOSIS — M9901 Segmental and somatic dysfunction of cervical region: Secondary | ICD-10-CM | POA: Diagnosis not present

## 2022-07-01 DIAGNOSIS — M9903 Segmental and somatic dysfunction of lumbar region: Secondary | ICD-10-CM | POA: Diagnosis not present

## 2022-07-04 ENCOUNTER — Ambulatory Visit (INDEPENDENT_AMBULATORY_CARE_PROVIDER_SITE_OTHER): Payer: 59 | Admitting: Family

## 2022-07-04 VITALS — BP 124/82 | HR 82 | Resp 18 | Ht 65.0 in | Wt 231.8 lb

## 2022-07-04 DIAGNOSIS — L304 Erythema intertrigo: Secondary | ICD-10-CM | POA: Diagnosis not present

## 2022-07-04 MED ORDER — NYSTATIN-TRIAMCINOLONE 100000-0.1 UNIT/GM-% EX CREA: 1.0000 | TOPICAL_CREAM | Freq: Two times a day (BID) | CUTANEOUS | 0 refills | Status: DC

## 2022-07-04 NOTE — Progress Notes (Signed)
[name] is a 42 y.o. female with the following history as recorded in EpicCare:  Patient Active Problem List   Diagnosis Date Noted   Cervical radiculopathy 11/29/2020   Morbid obesity ([facility]) 04/10/2020   Plantar fasciitis 07/09/2017   BMI 45.0-49.9, adult ([facility]) 06/24/2012   Anxiety and depression 06/24/2012   History of nephrolithiasis 06/24/2012   Hyperlipidemia 06/24/2012   Vitamin D deficiency 05/07/2011    Current Outpatient Medications  Medication Sig Dispense Refill   CRANBERRY PO Take 250 mg by mouth daily.     docusate sodium (COLACE) 100 MG capsule Take 1 capsule (100 mg total) by mouth 2 (two) times daily. 10 capsule 0   FLUoxetine (PROZAC) 40 MG capsule Take 40 mg by mouth daily.     nystatin-triamcinolone (MYCOLOG II) cream Apply 1 Application topically 2 (two) times daily. 60 g 0   pantoprazole (PROTONIX) 40 MG tablet Take 1 tablet (40 mg total) by mouth daily. 90 tablet 0   Semaglutide-Weight Management (WEGOVY) 0.5 MG/0.5ML SOAJ Inject 0.5 mg into the skin once a week. 2 mL 0   No current facility-administered medications for this visit.    Allergies: Patient has no known allergies.  Past Medical History:  Diagnosis Date   Anxiety    Depression    GERD (gastroesophageal reflux disease)    History of hiatal hernia    History of kidney stones    Kidney stones    PONV (postoperative nausea and vomiting)     Past Surgical History:  Procedure Laterality Date   BREAST SURGERY     reduction   CESAREAN SECTION  08/2007   x2   CHOLECYSTECTOMY     EYE SURGERY     as a child   LAPAROSCOPIC GASTRIC SLEEVE RESECTION N/A 04/10/2020   Procedure: LAPAROSCOPIC GASTRIC SLEEVE RESECTION;  Surgeon: Berna Bue, MD;  Location: [facility];  Service: General;  Laterality: N/A;   UPPER GI ENDOSCOPY N/A 04/10/2020   Procedure: UPPER GI ENDOSCOPY;  Surgeon: Berna Bue, MD;  Location: [facility];  Service: General;  Laterality: N/A;    Family History  Problem  Relation Age of Onset   Diabetes Father    Kidney disease Father    Heart disease Father    Diabetes Paternal Grandmother    Cervical cancer Sister     Social History   Tobacco Use   Smoking status: Former    Packs/day: 0.25    Years: 5.00    Additional pack years: 0.00    Total pack years: 1.25    Types: Cigarettes    Quit date: 2005    Years since quitting: 19.3   Smokeless tobacco: Never  Substance Use Topics   Alcohol use: Not Currently    Subjective:  Rash under right and left breast x 1 month; + itching; limited relief with topical cortisone; up to date on mammogram;    Objective:  Vitals:   07/04/22 1320  BP: 124/82  Pulse: 82  Resp: 18  SpO2: 100%  Weight: 231 lb 12.8 oz (105.1 kg)  Height: 5\' 5"  (1.651 m)    General: Well developed, well nourished, in no acute distress  Skin : Warm and dry. Mild erythema noted under right breast Head: Normocephalic and atraumatic  Lungs: Respirations unlabored;  Neurologic: Alert and oriented; speech intact; face symmetrical; moves all extremities well; CNII-XII intact without focal deficit   Assessment:  1. Intertrigo     Plan:  Reassurance; trial of Mycolog II  apply bid to affected area; follow up worse, no better.   No follow-ups on file.  No orders of the defined types were placed in this encounter.   Requested Prescriptions   Signed Prescriptions Disp Refills   nystatin-triamcinolone (MYCOLOG II) cream 60 g 0    Sig: Apply 1 Application topically 2 (two) times daily.

## 2022-07-09 DIAGNOSIS — M9903 Segmental and somatic dysfunction of lumbar region: Secondary | ICD-10-CM | POA: Diagnosis not present

## 2022-07-09 DIAGNOSIS — M9901 Segmental and somatic dysfunction of cervical region: Secondary | ICD-10-CM | POA: Diagnosis not present

## 2022-07-09 DIAGNOSIS — M9904 Segmental and somatic dysfunction of sacral region: Secondary | ICD-10-CM | POA: Diagnosis not present

## 2022-07-09 DIAGNOSIS — M9902 Segmental and somatic dysfunction of thoracic region: Secondary | ICD-10-CM | POA: Diagnosis not present

## 2022-07-16 DIAGNOSIS — M9904 Segmental and somatic dysfunction of sacral region: Secondary | ICD-10-CM | POA: Diagnosis not present

## 2022-07-16 DIAGNOSIS — M9903 Segmental and somatic dysfunction of lumbar region: Secondary | ICD-10-CM | POA: Diagnosis not present

## 2022-07-16 DIAGNOSIS — M9901 Segmental and somatic dysfunction of cervical region: Secondary | ICD-10-CM | POA: Diagnosis not present

## 2022-07-16 DIAGNOSIS — M9902 Segmental and somatic dysfunction of thoracic region: Secondary | ICD-10-CM | POA: Diagnosis not present

## 2022-07-16 MED ORDER — WEGOVY 1 MG/0.5ML ~~LOC~~ SOAJ: 1.0000 mg | SUBCUTANEOUS | 0 refills | Status: DC

## 2022-07-16 NOTE — Addendum Note (Signed)
Addended byConrad [place] D on: 07/16/2022 08:01 AM   Modules accepted: Orders

## 2022-07-24 DIAGNOSIS — M9904 Segmental and somatic dysfunction of sacral region: Secondary | ICD-10-CM | POA: Diagnosis not present

## 2022-07-24 DIAGNOSIS — M9903 Segmental and somatic dysfunction of lumbar region: Secondary | ICD-10-CM | POA: Diagnosis not present

## 2022-07-24 DIAGNOSIS — M9902 Segmental and somatic dysfunction of thoracic region: Secondary | ICD-10-CM | POA: Diagnosis not present

## 2022-07-24 DIAGNOSIS — M9901 Segmental and somatic dysfunction of cervical region: Secondary | ICD-10-CM | POA: Diagnosis not present

## 2022-07-31 DIAGNOSIS — M9901 Segmental and somatic dysfunction of cervical region: Secondary | ICD-10-CM | POA: Diagnosis not present

## 2022-07-31 DIAGNOSIS — M9902 Segmental and somatic dysfunction of thoracic region: Secondary | ICD-10-CM | POA: Diagnosis not present

## 2022-07-31 DIAGNOSIS — M9903 Segmental and somatic dysfunction of lumbar region: Secondary | ICD-10-CM | POA: Diagnosis not present

## 2022-07-31 DIAGNOSIS — M9904 Segmental and somatic dysfunction of sacral region: Secondary | ICD-10-CM | POA: Diagnosis not present

## 2022-08-04 ENCOUNTER — Encounter: Payer: Self-pay | Admitting: Family

## 2022-08-05 ENCOUNTER — Other Ambulatory Visit: Payer: Self-pay | Admitting: Family

## 2022-08-05 MED ORDER — FLUCONAZOLE 100 MG PO TABS: 100.0000 mg | ORAL_TABLET | Freq: Every day | ORAL | 0 refills | Status: DC

## 2022-08-28 ENCOUNTER — Ambulatory Visit: Payer: No Typology Code available for payment source | Admitting: Family

## 2022-08-28 ENCOUNTER — Encounter: Payer: Self-pay | Admitting: Family Medicine

## 2022-08-28 MED ORDER — WEGOVY 1.7 MG/0.75ML ~~LOC~~ SOAJ: 1.7000 mg | SUBCUTANEOUS | 0 refills | Status: DC

## 2022-08-30 NOTE — Progress Notes (Deleted)
[facility] at [facility] [address] [place], [place] [address] [phone]  Date:  09/03/2022   Name:  [name]   DOB:  1980/11/09   MRN:  [id number]  PCP:  Pearline Cables, MD    Chief Complaint: No chief complaint on file.   History of Present Illness:  [name] is a 42 y.o. very pleasant female patient who presents with the following:  Pt seen today with concern of a rash Last seen by myself in March of this year- we put her on Saxenda at that time   History of obesity, hyperlipidemia, vitamin D deficiency Husband is s/p vasectomy Her children are 96- daughter and 52- son   She ended up using Wegovy for a time and did well,but then coverage changed and it became too expensive for her to continue   Patient Active Problem List   Diagnosis Date Noted   Cervical radiculopathy 11/29/2020   Morbid obesity ([facility]) 04/10/2020   Plantar fasciitis 07/09/2017   BMI 45.0-49.9, adult ([facility]) 06/24/2012   Anxiety and depression 06/24/2012   History of nephrolithiasis 06/24/2012   Hyperlipidemia 06/24/2012   Vitamin D deficiency 05/07/2011    Past Medical History:  Diagnosis Date   Anxiety    Depression    GERD (gastroesophageal reflux disease)    History of hiatal hernia    History of kidney stones    Kidney stones    PONV (postoperative nausea and vomiting)     Past Surgical History:  Procedure Laterality Date   BREAST SURGERY     reduction   CESAREAN SECTION  08/2007   x2   CHOLECYSTECTOMY     EYE SURGERY     as a child   LAPAROSCOPIC GASTRIC SLEEVE RESECTION N/A 04/10/2020   Procedure: LAPAROSCOPIC GASTRIC SLEEVE RESECTION;  Surgeon: Berna Bue, MD;  Location: [facility];  Service: General;  Laterality: N/A;   UPPER GI ENDOSCOPY N/A 04/10/2020   Procedure: UPPER GI ENDOSCOPY;  Surgeon: Berna Bue, MD;  Location: [facility];  Service: General;  Laterality: N/A;    Social History   Tobacco  Use   Smoking status: Former    Packs/day: 0.25    Years: 5.00    Additional pack years: 0.00    Total pack years: 1.25    Types: Cigarettes    Quit date: 2005    Years since quitting: 19.4   Smokeless tobacco: Never  Vaping Use   Vaping Use: Never used  Substance Use Topics   Alcohol use: Not Currently   Drug use: No    Family History  Problem Relation Age of Onset   Diabetes Father    Kidney disease Father    Heart disease Father    Diabetes Paternal Grandmother    Cervical cancer Sister     No Known Allergies  Medication list has been reviewed and updated.  Current Outpatient Medications on File Prior to Visit  Medication Sig Dispense Refill   CRANBERRY PO Take 250 mg by mouth daily.     docusate sodium (COLACE) 100 MG capsule Take 1 capsule (100 mg total) by mouth 2 (two) times daily. 10 capsule 0   fluconazole (DIFLUCAN) 100 MG tablet Take 1 tablet (100 mg total) by mouth daily. 7 tablet 0   FLUoxetine (PROZAC) 40 MG capsule Take 40 mg by mouth daily.     nystatin-triamcinolone (MYCOLOG II) cream Apply 1 Application topically 2 (  two) times daily. 60 g 0   pantoprazole (PROTONIX) 40 MG tablet Take 1 tablet (40 mg total) by mouth daily. 90 tablet 0   Semaglutide-Weight Management (WEGOVY) 1.7 MG/0.75ML SOAJ Inject 1.7 mg into the skin once a week. 3 mL 0   No current facility-administered medications on file prior to visit.    Review of Systems:  As per HPI- otherwise negative.   Physical Examination: There were no vitals filed for this visit. There were no vitals filed for this visit. There is no height or weight on file to calculate BMI. Ideal Body Weight:    GEN: no acute distress. HEENT: Atraumatic, Normocephalic.  Ears and Nose: No external deformity. CV: RRR, No M/G/R. No JVD. No thrill. No extra heart sounds. PULM: CTA B, no wheezes, crackles, rhonchi. No retractions. No resp. distress. No accessory muscle use. ABD: S, NT, ND, +BS. No rebound. No  HSM. EXTR: No c/c/e PSYCH: Normally interactive. Conversant.    Assessment and Plan: ***  Signed Abbe Amsterdam, MD

## 2022-09-03 ENCOUNTER — Ambulatory Visit: Payer: No Typology Code available for payment source | Admitting: Family Medicine

## 2022-09-06 MED ORDER — PHENTERMINE HCL 15 MG PO CAPS: 15.0000 mg | ORAL_CAPSULE | ORAL | 2 refills | Status: DC

## 2022-09-08 ENCOUNTER — Telehealth: Payer: Self-pay

## 2022-09-08 NOTE — Telephone Encounter (Signed)
PA has been started

## 2022-09-08 NOTE — Telephone Encounter (Signed)
PA has been initiated.   [name] (Key: [id number])

## 2022-09-09 NOTE — Telephone Encounter (Signed)
The request is approved from 09/08/2022 to 12/07/2022.

## 2022-09-16 ENCOUNTER — Encounter: Payer: Self-pay | Admitting: Family

## 2022-09-16 ENCOUNTER — Ambulatory Visit (INDEPENDENT_AMBULATORY_CARE_PROVIDER_SITE_OTHER): Payer: No Typology Code available for payment source | Admitting: Family

## 2022-09-16 VITALS — BP 116/78 | HR 90 | Ht 65.0 in | Wt 231.8 lb

## 2022-09-16 DIAGNOSIS — R21 Rash and other nonspecific skin eruption: Secondary | ICD-10-CM

## 2022-09-16 NOTE — Progress Notes (Signed)
[name] is a 42 y.o. female with the following history as recorded in EpicCare:  Patient Active Problem List   Diagnosis Date Noted   Cervical radiculopathy 11/29/2020   Morbid obesity ([facility]) 04/10/2020   Plantar fasciitis 07/09/2017   BMI 45.0-49.9, adult ([facility]) 06/24/2012   Anxiety and depression 06/24/2012   History of nephrolithiasis 06/24/2012   Hyperlipidemia 06/24/2012   Vitamin D deficiency 05/07/2011    Current Outpatient Medications  Medication Sig Dispense Refill   CRANBERRY PO Take 250 mg by mouth daily.     docusate sodium (COLACE) 100 MG capsule Take 1 capsule (100 mg total) by mouth 2 (two) times daily. 10 capsule 0   pantoprazole (PROTONIX) 40 MG tablet Take 1 tablet (40 mg total) by mouth daily. 90 tablet 0   phentermine 15 MG capsule Take 1 capsule (15 mg total) by mouth every morning. 30 capsule 2   No current facility-administered medications for this visit.    Allergies: Patient has no known allergies.  Past Medical History:  Diagnosis Date   Anxiety    Depression    GERD (gastroesophageal reflux disease)    History of hiatal hernia    History of kidney stones    Kidney stones    PONV (postoperative nausea and vomiting)     Past Surgical History:  Procedure Laterality Date   BREAST SURGERY     reduction   CESAREAN SECTION  08/2007   x2   CHOLECYSTECTOMY     EYE SURGERY     as a child   LAPAROSCOPIC GASTRIC SLEEVE RESECTION N/A 04/10/2020   Procedure: LAPAROSCOPIC GASTRIC SLEEVE RESECTION;  Surgeon: Berna Bue, MD;  Location: [facility];  Service: General;  Laterality: N/A;   UPPER GI ENDOSCOPY N/A 04/10/2020   Procedure: UPPER GI ENDOSCOPY;  Surgeon: Berna Bue, MD;  Location: [facility];  Service: General;  Laterality: N/A;    Family History  Problem Relation Age of Onset   Diabetes Father    Kidney disease Father    Heart disease Father    Diabetes Paternal Grandmother    Cervical cancer Sister     Social History   Tobacco  Use   Smoking status: Former    Packs/day: 0.25    Years: 5.00    Additional pack years: 0.00    Total pack years: 1.25    Types: Cigarettes    Quit date: 2005    Years since quitting: 19.5   Smokeless tobacco: Never  Substance Use Topics   Alcohol use: Not Currently    Subjective:   Concerned for persistent rash under right breast- treated initially for suspected intertrigo in early April 2024 and responded very well to treatment; had called back in mid-June with recurrent symptoms- notes that symptoms resolved within a few days of applying topical cream and she cancelled appointment; symptoms again re-flared 2 days ago;  Normal mammogram including diagnostic mammogram of right breast in February 2024;  Normal Hgba1c in March 2024;  Per patient no nipple discharge or changes in soaps, detergents, medications;   Objective:  Vitals:   09/16/22 1419  BP: 116/78  Pulse: 90  SpO2: 100%  Weight: 231 lb 12.8 oz (105.1 kg)  Height: 5\' 5"  (1.651 m)    General: Well developed, well nourished, in no acute distress  Skin : Warm and dry. Localized area of mild erythema/ lacy appearance noted on underside of both breasts; no lesions noted;  Head: Normocephalic and atraumatic  Lungs: Respirations  unlabored;  Neurologic: Alert and oriented; speech intact; face symmetrical; moves all extremities well; CNII-XII intact without focal deficit   Assessment:  1. Rash     Plan:  Symptoms are recurring and appearance today is not consistent with intertrigo; ? Heat type rash; due to atypical presentation, would like to get biopsy with dermatology if possible- she is already established patient with [facility]; will repeat CBC, CMP, TSH; follow up to be determined;   No follow-ups on file.  Orders Placed This Encounter  Procedures   CBC with Differential/Platelet   Comp Met (CMET)   TSH   Ambulatory referral to Dermatology    Referral Priority:   Emergency    Referral Type:    Consultation    Referral Reason:   Specialty Services Required    Requested Specialty:   Dermatology    Number of Visits Requested:   1    Requested Prescriptions    No prescriptions requested or ordered in this encounter

## 2022-09-17 LAB — CBC WITH DIFFERENTIAL/PLATELET
Basophils Absolute: 0.1 10*3/uL (ref 0.0–0.1)
Basophils Relative: 0.7 % (ref 0.0–3.0)
Eosinophils Absolute: 0.5 10*3/uL (ref 0.0–0.7)
Eosinophils Relative: 3.7 % (ref 0.0–5.0)
HCT: 33.2 % — ABNORMAL LOW (ref 36.0–46.0)
Hemoglobin: 10.5 g/dL — ABNORMAL LOW (ref 12.0–15.0)
Lymphocytes Relative: 12.9 % (ref 12.0–46.0)
Lymphs Abs: 1.6 10*3/uL (ref 0.7–4.0)
MCHC: 31.6 g/dL (ref 30.0–36.0)
MCV: 74.9 fl — ABNORMAL LOW (ref 78.0–100.0)
Monocytes Absolute: 0.7 10*3/uL (ref 0.1–1.0)
Monocytes Relative: 5.5 % (ref 3.0–12.0)
Neutro Abs: 9.8 10*3/uL — ABNORMAL HIGH (ref 1.4–7.7)
Neutrophils Relative %: 77.2 % — ABNORMAL HIGH (ref 43.0–77.0)
Platelets: 292 10*3/uL (ref 150.0–400.0)
RBC: 4.44 Mil/uL (ref 3.87–5.11)
RDW: 15.3 % (ref 11.5–15.5)
WBC: 12.7 10*3/uL — ABNORMAL HIGH (ref 4.0–10.5)

## 2022-09-17 LAB — COMPREHENSIVE METABOLIC PANEL
ALT: 9 U/L (ref 0–35)
AST: 11 U/L (ref 0–37)
Albumin: 3.9 g/dL (ref 3.5–5.2)
Alkaline Phosphatase: 54 U/L (ref 39–117)
BUN: 17 mg/dL (ref 6–23)
CO2: 27 mEq/L (ref 19–32)
Calcium: 9.3 mg/dL (ref 8.4–10.5)
Chloride: 105 mEq/L (ref 96–112)
Creatinine, Ser: 0.77 mg/dL (ref 0.40–1.20)
GFR: 94.47 mL/min (ref >60.00)
Glucose, Bld: 117 mg/dL — ABNORMAL HIGH (ref 70–99)
Potassium: 4.1 mEq/L (ref 3.5–5.1)
Sodium: 139 mEq/L (ref 135–145)
Total Bilirubin: 0.2 mg/dL (ref 0.2–1.2)
Total Protein: 6.2 g/dL (ref 6.0–8.3)

## 2022-09-17 LAB — TSH: TSH: 1.14 u[IU]/mL (ref 0.35–5.50)

## 2022-09-18 ENCOUNTER — Encounter: Payer: Self-pay | Admitting: Family

## 2022-09-18 ENCOUNTER — Other Ambulatory Visit: Payer: Self-pay | Admitting: Family

## 2022-09-18 DIAGNOSIS — D582 Other hemoglobinopathies: Secondary | ICD-10-CM

## 2022-09-19 ENCOUNTER — Other Ambulatory Visit: Payer: Self-pay | Admitting: Family

## 2022-09-19 NOTE — Progress Notes (Signed)
Please see mychart message.

## 2022-09-20 ENCOUNTER — Encounter: Payer: Self-pay | Admitting: Family Medicine

## 2022-09-22 ENCOUNTER — Other Ambulatory Visit: Payer: Self-pay | Admitting: Family

## 2022-09-22 ENCOUNTER — Other Ambulatory Visit (INDEPENDENT_AMBULATORY_CARE_PROVIDER_SITE_OTHER): Payer: No Typology Code available for payment source

## 2022-09-22 DIAGNOSIS — D582 Other hemoglobinopathies: Secondary | ICD-10-CM | POA: Diagnosis not present

## 2022-09-22 DIAGNOSIS — R790 Abnormal level of blood mineral: Secondary | ICD-10-CM

## 2022-09-22 LAB — IBC + FERRITIN
Ferritin: 2.7 ng/mL — ABNORMAL LOW (ref 10.0–291.0)
Iron: 34 ug/dL — ABNORMAL LOW (ref 42–145)
Saturation Ratios: 8 % — ABNORMAL LOW (ref 20.0–50.0)
TIBC: 422.8 ug/dL (ref 250.0–450.0)
Transferrin: 302 mg/dL (ref 212.0–360.0)

## 2022-09-22 LAB — CBC WITH DIFFERENTIAL/PLATELET
Basophils Absolute: 0.1 10*3/uL (ref 0.0–0.1)
Basophils Relative: 1.5 % (ref 0.0–3.0)
Eosinophils Absolute: 0.5 10*3/uL (ref 0.0–0.7)
Eosinophils Relative: 6.1 % — ABNORMAL HIGH (ref 0.0–5.0)
HCT: 35.1 % — ABNORMAL LOW (ref 36.0–46.0)
Hemoglobin: 11.1 g/dL — ABNORMAL LOW (ref 12.0–15.0)
Lymphocytes Relative: 25 % (ref 12.0–46.0)
Lymphs Abs: 1.9 10*3/uL (ref 0.7–4.0)
MCHC: 31.5 g/dL (ref 30.0–36.0)
MCV: 74.7 fl — ABNORMAL LOW (ref 78.0–100.0)
Monocytes Absolute: 0.5 10*3/uL (ref 0.1–1.0)
Monocytes Relative: 6.8 % (ref 3.0–12.0)
Neutro Abs: 4.7 10*3/uL (ref 1.4–7.7)
Neutrophils Relative %: 60.6 % (ref 43.0–77.0)
Platelets: 331 10*3/uL (ref 150.0–400.0)
RBC: 4.7 Mil/uL (ref 3.87–5.11)
RDW: 15 % (ref 11.5–15.5)
WBC: 7.7 10*3/uL (ref 4.0–10.5)

## 2022-10-13 ENCOUNTER — Inpatient Hospital Stay: Payer: No Typology Code available for payment source

## 2022-10-13 ENCOUNTER — Inpatient Hospital Stay: Payer: No Typology Code available for payment source | Attending: Family | Admitting: Medical Oncology

## 2022-10-13 ENCOUNTER — Encounter: Payer: Self-pay | Admitting: Family

## 2022-10-13 VITALS — BP 126/69 | HR 77 | Temp 99.4°F | Resp 17 | Ht 64.5 in | Wt 233.1 lb

## 2022-10-13 DIAGNOSIS — D509 Iron deficiency anemia, unspecified: Secondary | ICD-10-CM | POA: Insufficient documentation

## 2022-10-13 DIAGNOSIS — D508 Other iron deficiency anemias: Secondary | ICD-10-CM | POA: Insufficient documentation

## 2022-10-13 DIAGNOSIS — Z79899 Other long term (current) drug therapy: Secondary | ICD-10-CM | POA: Insufficient documentation

## 2022-10-13 DIAGNOSIS — N939 Abnormal uterine and vaginal bleeding, unspecified: Secondary | ICD-10-CM | POA: Diagnosis not present

## 2022-10-13 DIAGNOSIS — Z9884 Bariatric surgery status: Secondary | ICD-10-CM | POA: Diagnosis not present

## 2022-10-13 NOTE — Progress Notes (Signed)
[facility] [phone]   [phone]  INITIAL CONSULT NOTE  Patient Care Team: Copland, Gwenlyn Found, MD as PCP - General (Family Medicine) Clent Ridges, MD (Inactive) as Attending Physician (Urology) Darnell Level, MD (General Surgery) Merwyn Katos, DPM as Consulting Physician (Podiatry)  CHIEF COMPLAINTS/PURPOSE OF CONSULTATION:  IDA  HISTORY OF PRESENTING ILLNESS:  [name] 42 y.o. female is referred to our office by Dr. Dallas Schimke for IDA. 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   She had bariatric surgery 04/2020- VSG sleeve. She is not currently taking her nutritional supplements.   Blood in stools: No Blood in urine: No Difficulty swallowing: No Pica: No SOB: No Fatigue: Yes Change of bowel movement/constipation: No Prior blood transfusion: No Prior history of blood loss: No Liver disease: No Bariatric surgery: Yes- see above- is not on multivitamins UC/Crohn's: No Frequent Blood Donation: Yes- last donation was about 8 months ago Vaginal bleeding: Regular. Lasting about 4 days. Moderate in flow. Clots.  Prior evaluation with hematology: No Prior bone marrow biopsy: No Oral iron: No- severe constipation Prior IV iron infusions: No Family history Anemia: Thalassemia - no history of sickle cell  No Personal or family history of bleeding or clotting disorders.  Not on any special diets: No Iron rich foods in diet: Yes No ETOH use:  No upcoming planned pregnancies and not currently pregnant: No  MEDICAL HISTORY:  Past Medical History:  Diagnosis Date   Anxiety    Depression    GERD (gastroesophageal reflux disease)    History of hiatal hernia    History of kidney stones    Kidney stones    PONV (postoperative nausea and vomiting)     SURGICAL HISTORY: Past Surgical History:  Procedure Laterality Date   BREAST SURGERY     reduction   CESAREAN SECTION   08/2007   x2   CHOLECYSTECTOMY     EYE SURGERY     as a child   LAPAROSCOPIC GASTRIC SLEEVE RESECTION N/A 04/10/2020   Procedure: LAPAROSCOPIC GASTRIC SLEEVE RESECTION;  Surgeon: Berna Bue, MD;  Location: [facility];  Service: General;  Laterality: N/A;   UPPER GI ENDOSCOPY N/A 04/10/2020   Procedure: UPPER GI ENDOSCOPY;  Surgeon: Berna Bue, MD;  Location: [facility];  Service: General;  Laterality: N/A;    SOCIAL HISTORY: Social History   Socioeconomic History   Marital status: Married    Spouse name: Not on file   Number of children: Not on file   Years of education: Not on file   Highest education level: Bachelor's degree (e.g., BA, AB, BS)  Occupational History   Occupation: [employer]    Employer: Advertising copywriter  Tobacco Use   Smoking status: Former    Current packs/day: 0.00    Average packs/day: 0.3 packs/day for 5.0 years (1.3 ttl pk-yrs)    Types: Cigarettes    Start date: 2000    Quit date: 2005    Years since quitting: 19.6   Smokeless tobacco: Never  Vaping Use   Vaping status: Never Used  Substance and Sexual Activity   Alcohol use: Not Currently   Drug use: No   Sexual activity: Not on file  Other Topics Concern   Not on file  Social History Narrative   Married   1 son   1 daughter   Social Determinants of Health   Financial Resource Strain: Low Risk  (07/04/2022)  Overall Financial Resource Strain (CARDIA)    Difficulty of Paying Living Expenses: Not hard at all  Food Insecurity: No Food Insecurity (10/13/2022)   Hunger Vital Sign    Worried About Running Out of Food in the Last Year: Never true    Ran Out of Food in the Last Year: Never true  Transportation Needs: No Transportation Needs (07/04/2022)   PRAPARE - Administrator, Civil Service (Medical): No    Lack of Transportation (Non-Medical): No  Physical Activity: Insufficiently Active (07/04/2022)   Exercise Vital Sign    Days of Exercise per Week: 2 days    Minutes of Exercise  per Session: 20 min  Stress: No Stress Concern Present (07/04/2022)   [employer] of Occupational Health - Occupational Stress Questionnaire    Feeling of Stress : Not at all  Social Connections: Moderately Integrated (07/04/2022)   Social Connection and Isolation Panel [NHANES]    Frequency of Communication with Friends and Family: More than three times a week    Frequency of Social Gatherings with Friends and Family: Patient declined    Attends Religious Services: More than 4 times per year    Active Member of [employer] or Organizations: No    Attends Engineer, structural: Not on file    Marital Status: Married  Catering manager Violence: Not At Risk (10/13/2022)   Humiliation, Afraid, Rape, and Kick questionnaire    Fear of Current or Ex-Partner: No    Emotionally Abused: No    Physically Abused: No    Sexually Abused: No    FAMILY HISTORY: Family History  Problem Relation Age of Onset   Diabetes Father    Kidney disease Father    Heart disease Father    Diabetes Paternal Grandmother    Cervical cancer Sister     ALLERGIES:  has No Known Allergies.  MEDICATIONS:  Current Outpatient Medications  Medication Sig Dispense Refill   CRANBERRY PO Take 250 mg by mouth daily.     docusate sodium (COLACE) 100 MG capsule Take 1 capsule (100 mg total) by mouth 2 (two) times daily. 10 capsule 0   FLUoxetine (PROZAC) 20 MG capsule Take 20 mg by mouth daily.     LINZESS 145 MCG CAPS capsule Take 145 mcg by mouth daily.     melatonin 5 MG TABS Take 5 mg by mouth at bedtime as needed.     pantoprazole (PROTONIX) 40 MG tablet Take 1 tablet (40 mg total) by mouth daily. 90 tablet 0   phentermine 15 MG capsule Take 1 capsule (15 mg total) by mouth every morning. 30 capsule 2   triamcinolone cream (KENALOG) 0.1 % Apply 1 Application topically 2 (two) times daily.     No current facility-administered medications for this visit.    REVIEW OF SYSTEMS:   Constitutional: ( - ) fevers,  ( - )  chills , ( - ) night sweats Eyes: ( - ) blurriness of vision, ( - ) double vision, ( - ) watery eyes Ears, nose, mouth, throat, and face: ( - ) mucositis, ( - ) sore throat Respiratory: ( - ) cough, ( - ) dyspnea, ( - ) wheezes Cardiovascular: ( - ) palpitation, ( - ) chest discomfort, ( - ) lower extremity swelling Gastrointestinal:  ( - ) nausea, ( - ) heartburn, ( - ) change in bowel habits Skin: ( - ) abnormal skin rashes Lymphatics: ( - ) new lymphadenopathy, ( - ) easy bruising Neurological: ( - )  numbness, ( - ) tingling, ( - ) new weaknesses Behavioral/Psych: ( - ) mood change, ( - ) new changes  All other systems were reviewed with the patient and are negative.  PHYSICAL EXAMINATION: ECOG PERFORMANCE STATUS: 1 - Symptomatic but completely ambulatory  Vitals:   10/13/22 1514  BP: 126/69  Pulse: 77  Resp: 17  Temp: 99.4 F (37.4 C)  SpO2: 100%   Filed Weights   10/13/22 1514  Weight: 233 lb 1.3 oz (105.7 kg)    GENERAL: well appearing female in NAD  SKIN: skin color, texture, turgor are normal, no rashes or significant lesions EYES: conjunctiva are pink and non-injected, sclera clear OROPHARYNX: no exudate, no erythema; lips, buccal mucosa, and tongue normal  NECK: supple, non-tender LYMPH:  no palpable lymphadenopathy in the cervical, axillary or supraclavicular lymph nodes.  LUNGS: clear to auscultation and percussion with normal breathing effort HEART: regular rate & rhythm and no murmurs and no lower extremity edema ABDOMEN: soft, non-tender, non-distended, normal bowel sounds Musculoskeletal: no cyanosis of digits and no clubbing  PSYCH: alert & oriented x 3, fluent speech NEURO: no focal motor/sensory deficits  LABORATORY DATA:  I have reviewed the data as listed    Latest Ref Rng & Units 09/22/2022    9:02 AM 09/16/2022    2:58 PM 05/29/2022    1:21 PM  CBC  WBC 4.0 - 10.5 K/uL 7.7  12.7  11.7   Hemoglobin 12.0 - 15.0 g/dL 56.2  13.0  86.5    Hematocrit 36.0 - 46.0 % 35.1  33.2  33.2   Platelets 150.0 - 400.0 K/uL 331.0  292.0  327.0        Latest Ref Rng & Units 09/16/2022    2:58 PM 05/29/2022    1:21 PM 04/11/2020    5:18 AM  CMP  Glucose 70 - 99 mg/dL 784  83  696   BUN 6 - 23 mg/dL 17  22  8    Creatinine 0.40 - 1.20 mg/dL 2.95  2.84  1.32   Sodium 135 - 145 mEq/L 139  139  137   Potassium 3.5 - 5.1 mEq/L 4.1  4.1  4.2   Chloride 96 - 112 mEq/L 105  105  104   CO2 19 - 32 mEq/L 27  26  23    Calcium 8.4 - 10.5 mg/dL 9.3  9.1  8.3   Total Protein 6.0 - 8.3 g/dL 6.2  6.4  6.6   Total Bilirubin 0.2 - 1.2 mg/dL 0.2  0.2  0.7   Alkaline Phos 39 - 117 U/L 54  51  49   AST 0 - 37 U/L 11  12  33   ALT 0 - 35 U/L 9  11  38      ASSESSMENT & PLAN BEVYN DEW is a 42 y.o. female who was referred to [place] for IDA. Her IDA is likely secondary to her bariatric history and moderate menses. Review of her referring labs from 09/22/2022 show a Hgb of 11.1, MCV of 74.7, platelets of 331, iron saturation of 8%, ferritin of 2.7, ANC of 4.7, WBC of 7.7.   Our plan is to start her on IV iron to correct her IDA. We will see her back in 2 months thereafter to recheck labs. We discussed risks/benefits of IV iron along with how this treatment is administered. We discussed that it takes weeks for blood changes to occur which is why follow up is 2-3 months out from  treatment. We also reviewed red flag signs and symptoms indicating that she would benefit from an earlier follow up.    Disposition Iv Venofer 300 mg weekly x 3 placed  RTC 2 months APP, labs labs (CBC w/, CMP, iron, ferritin, retic)-Melvin    Orders Placed This Encounter  Procedures   CBC with Differential (Cancer Center Only)    Standing Status:   Future    Standing Expiration Date:   10/13/2023   Iron and Iron Binding Capacity ([facility] only)    Standing Status:   Future    Standing Expiration Date:   10/13/2023   Ferritin    Standing Status:   Future    Standing Expiration  Date:   10/13/2023   Retic Panel    Standing Status:   Future    Standing Expiration Date:   10/13/2023    All questions were answered. The patient knows to call the clinic with any problems, questions or concerns.  I have spent a total of 40 minutes minutes of face-to-face and non-face-to-face time, preparing to see the patient, obtaining and/or reviewing separately obtained history, performing a medically appropriate examination, counseling and educating the patient, ordering medications/tests/procedures, referring and communicating with other health care professionals, documenting clinical information in the electronic health record, independently interpreting results and communicating results to the patient, and care coordination.    Clent Jacks PA-C Department of Hematology/Oncology [facility]

## 2022-10-16 ENCOUNTER — Telehealth: Payer: Self-pay | Admitting: Family

## 2022-10-16 NOTE — Telephone Encounter (Signed)
Left detailed voicemail for patient stating iron infusion and follow up schedule appointment date and times, provided call back number for office to confirm and or change appointments.

## 2022-10-20 ENCOUNTER — Encounter: Payer: Self-pay | Admitting: Family

## 2022-10-23 ENCOUNTER — Inpatient Hospital Stay: Payer: No Typology Code available for payment source

## 2022-10-24 ENCOUNTER — Inpatient Hospital Stay: Payer: No Typology Code available for payment source | Attending: Family

## 2022-10-24 VITALS — BP 125/65 | HR 64 | Temp 98.7°F | Resp 16

## 2022-10-24 DIAGNOSIS — Z79899 Other long term (current) drug therapy: Secondary | ICD-10-CM | POA: Diagnosis not present

## 2022-10-24 DIAGNOSIS — D508 Other iron deficiency anemias: Secondary | ICD-10-CM | POA: Diagnosis present

## 2022-10-24 MED ORDER — ALTEPLASE 2 MG IJ SOLR: 2.0000 mg | Freq: Once | INTRAMUSCULAR | Status: DC | PRN

## 2022-10-24 MED ORDER — SODIUM CHLORIDE 0.9 % IV SOLN: Freq: Once | INTRAVENOUS | Status: AC

## 2022-10-24 MED ORDER — HEPARIN SOD (PORK) LOCK FLUSH 100 UNIT/ML IV SOLN: 500.0000 [IU] | Freq: Once | INTRAVENOUS | Status: DC | PRN

## 2022-10-24 MED ORDER — SODIUM CHLORIDE 0.9 % IV SOLN
300.0000 mg | Freq: Once | INTRAVENOUS | Status: AC
  Administered 2022-10-24: 300 mg via INTRAVENOUS
  Filled 2022-10-24: qty 300

## 2022-10-24 MED ORDER — SODIUM CHLORIDE 0.9% FLUSH: 10.0000 mL | Freq: Once | INTRAVENOUS | Status: DC | PRN

## 2022-10-24 MED ORDER — LORATADINE 10 MG PO TABS
10.0000 mg | ORAL_TABLET | Freq: Once | ORAL | Status: AC
  Administered 2022-10-24: 10 mg via ORAL
  Filled 2022-10-24: qty 1

## 2022-10-24 MED ORDER — CETIRIZINE HCL 10 MG PO TABS: 10.0000 mg | ORAL_TABLET | Freq: Once | ORAL | Status: DC

## 2022-10-24 NOTE — Patient Instructions (Signed)
 Iron Sucrose Injection What is this medication? IRON SUCROSE (EYE ern SOO krose) treats low levels of iron (iron deficiency anemia) in people with kidney disease. Iron is a mineral that plays an important role in making red blood cells, which carry oxygen from your lungs to the rest of your body. This medicine may be used for other purposes; ask your health care provider or pharmacist if you have questions. COMMON BRAND NAME(S): Venofer What should I tell my care team before I take this medication? They need to know if you have any of these conditions: Anemia not caused by low iron levels Heart disease High levels of iron in the blood Kidney disease Liver disease An unusual or allergic reaction to iron, other medications, foods, dyes, or preservatives Pregnant or trying to get pregnant Breastfeeding How should I use this medication? This medication is for infusion into a vein. It is given in a hospital or clinic setting. Talk to your care team about the use of this medication in children. While this medication may be prescribed for children as young as 2 years for selected conditions, precautions do apply. Overdosage: If you think you have taken too much of this medicine contact a poison control center or emergency room at once. NOTE: This medicine is only for you. Do not share this medicine with others. What if I miss a dose? Keep appointments for follow-up doses. It is important not to miss your dose. Call your care team if you are unable to keep an appointment. What may interact with this medication? Do not take this medication with any of the following: Deferoxamine Dimercaprol Other iron products This medication may also interact with the following: Chloramphenicol Deferasirox This list may not describe all possible interactions. Give your health care provider a list of all the medicines, herbs, non-prescription drugs, or dietary supplements you use. Also tell them if you smoke,  drink alcohol, or use illegal drugs. Some items may interact with your medicine. What should I watch for while using this medication? Visit your care team regularly. Tell your care team if your symptoms do not start to get better or if they get worse. You may need blood work done while you are taking this medication. You may need to follow a special diet. Talk to your care team. Foods that contain iron include: whole grains/cereals, dried fruits, beans, or peas, leafy green vegetables, and organ meats (liver, kidney). What side effects may I notice from receiving this medication? Side effects that you should report to your care team as soon as possible: Allergic reactions--skin rash, itching, hives, swelling of the face, lips, tongue, or throat Low blood pressure--dizziness, feeling faint or lightheaded, blurry vision Shortness of breath Side effects that usually do not require medical attention (report to your care team if they continue or are bothersome): Flushing Headache Joint pain Muscle pain Nausea Pain, redness, or irritation at injection site This list may not describe all possible side effects. Call your doctor for medical advice about side effects. You may report side effects to FDA at 1-800-FDA-1088. Where should I keep my medication? This medication is given in a hospital or clinic. It will not be stored at home. NOTE: This sheet is a summary. It may not cover all possible information. If you have questions about this medicine, talk to your doctor, pharmacist, or health care provider.  2024 Elsevier/Gold Standard (2022-08-01 00:00:00)

## 2022-10-24 NOTE — Progress Notes (Signed)
Zyrtec not available, changed premedication for Venofer to Claritin per Dr. Gustavo Lah instructions. Patient stated she has taken Claritin in the past.

## 2022-10-28 ENCOUNTER — Telehealth: Payer: Self-pay | Admitting: Family

## 2022-10-28 NOTE — Telephone Encounter (Signed)
Patient called stating she wanted to reschedule last two remaining IV Iron appts due to her starting her perion two weeks early after the first completed infusion on 10/24/2022. She said she felt woozy and bad after the last infusion and wants to reschedule for 9/20 after she comes back from vacation because she doesn't want to feel bad or be on her period and her appointments must be scheduled on a Friday. Appointments moved per patients request.

## 2022-10-29 ENCOUNTER — Telehealth: Payer: Self-pay

## 2022-10-29 NOTE — Telephone Encounter (Signed)
Pt called in stating that after her first dose of iron (Venofer) on Friday, 10/24/22, that she had some abnormal s/s and wanted to know if it was due to the iron infusion she received. She said afterwards she felt tired, which she thought was normal but then on Sunday evening she started vomiting and having diarrhea. Pt compared it to having food poisoning but states that no one else in her family that ate the same food had these symptoms. Pt is no longer vomiting but is still having diarrhea. She also stated that her period started 2 weeks early. Advised pt to rest and hydrate and discussed what s/s to be aware of in the event she needed to go to the ER. Pt has two more doses of Venofer scheduled in September. Maralyn Sago, should we change the iron she is receiving?

## 2022-10-30 ENCOUNTER — Telehealth: Payer: Self-pay | Admitting: *Deleted

## 2022-10-30 ENCOUNTER — Other Ambulatory Visit: Payer: Self-pay | Admitting: Pharmacist

## 2022-10-30 ENCOUNTER — Other Ambulatory Visit: Payer: Self-pay | Admitting: *Deleted

## 2022-10-30 ENCOUNTER — Inpatient Hospital Stay: Payer: No Typology Code available for payment source

## 2022-10-30 MED ORDER — ONDANSETRON HCL 8 MG PO TABS: 8.0000 mg | ORAL_TABLET | Freq: Three times a day (TID) | ORAL | 0 refills | Status: DC | PRN

## 2022-10-30 NOTE — Progress Notes (Signed)
Famotidine 20 mg IV added to patient's premedications for Venofer per Dr. Gustavo Lah instructions.

## 2022-10-30 NOTE — Telephone Encounter (Signed)
Reviewed with Patient the message from yesterday where Claritin directions were given prior to IV iron per Clent Jacks PA.  Also Hoy Finlay, oncology pharmacist about adding Pepcid 20 mg IV as a premedication for Venofer next time

## 2022-10-30 NOTE — Telephone Encounter (Signed)
Received a call from patient stating that she received iron on Friday here in our office and has experienced nausea, vomiting and diarrhea since then.  Was concerned about the diarrhea that she is still having.  Educated patient on how to take Imodium OTC to stop diarrhea.   If diarrhea does not stop, patient is to call our office.  Will send a Rx for Zofran to patients pharmacy per Dr Myna Hidalgo.  Premedications will also be added to patients iron treatment next time.  Reviewed with Patient all of the above.  Patient appreciates this advice.

## 2022-10-31 ENCOUNTER — Encounter: Payer: Self-pay | Admitting: Hematology & Oncology

## 2022-10-31 NOTE — Telephone Encounter (Signed)
Advised pt and she states understanding. Pt's next two iron infusions are 1 week apart (11/28/22 and 12/05/22), so pt is going to start Claritin on 11/21/22 and take once daily until 12/12/22.

## 2022-11-06 ENCOUNTER — Inpatient Hospital Stay: Payer: No Typology Code available for payment source

## 2022-11-13 ENCOUNTER — Encounter (HOSPITAL_COMMUNITY): Payer: Self-pay | Admitting: *Deleted

## 2022-11-28 ENCOUNTER — Inpatient Hospital Stay: Payer: No Typology Code available for payment source

## 2022-12-05 ENCOUNTER — Inpatient Hospital Stay: Payer: No Typology Code available for payment source

## 2022-12-11 ENCOUNTER — Other Ambulatory Visit (HOSPITAL_COMMUNITY): Payer: Self-pay

## 2022-12-11 ENCOUNTER — Encounter: Payer: Self-pay | Admitting: Hematology & Oncology

## 2022-12-11 ENCOUNTER — Telehealth: Payer: Self-pay | Admitting: Pharmacist

## 2022-12-11 NOTE — Telephone Encounter (Signed)
Pharmacy Patient Advocate Encounter   Received notification from CoverMyMeds that prior authorization for Phentermine HCl 15MG  capsules is required/requested.   Insurance verification completed.   The patient is insured through [facility] .   Per test claim: PA required; PA submitted to [employer] [facility] via CoverMyMeds Key/confirmation #/EOC [id number] Status is pending

## 2022-12-15 ENCOUNTER — Inpatient Hospital Stay: Payer: No Typology Code available for payment source | Admitting: Medical Oncology

## 2022-12-15 ENCOUNTER — Inpatient Hospital Stay: Payer: No Typology Code available for payment source

## 2022-12-16 ENCOUNTER — Encounter: Payer: Self-pay | Admitting: Family Medicine

## 2022-12-16 NOTE — Telephone Encounter (Signed)
Pharmacy Patient Advocate Encounter  Received notification from [employer] [facility] that Prior Authorization for Phentermine HCl 15MG  capsules has been DENIED.  Full denial letter will be uploaded to the media tab. See denial reason below.  Your plan only covers this drug for 3 months within the past 365 days for your health condition. We have denied your request for this drug because it is for longer treatment. We reviewed the information we had. Your doctor can send [place] any new or missing information for [place] to review. For this drug, you may have to meet other criteria. You can request the drug policy for more details.   PA #/Case ID/Reference #: [id number]  Please be advised we currently do not have a Pharmacist to review denials, therefore you will need to process appeals accordingly as needed. Thanks for your support at this time. Contact for appeals are as follows: Phone: [phone], Fax: [phone]   Last day to appeal is 180 days from 12-12-2022

## 2023-02-18 ENCOUNTER — Ambulatory Visit: Payer: No Typology Code available for payment source | Admitting: Family Medicine

## 2023-05-27 NOTE — Patient Instructions (Signed)
It was good to see you today, I will be in touch with your lab work- and will send in your insurance forms asap Please ask [facility] if they can compound your hemorrhoid suppositories - if they can I am glad to send there

## 2023-05-27 NOTE — Progress Notes (Unsigned)
 [facility] [address], [address] [place], [place] [address] [phone]  Date:  05/28/2023   Name:  LAZARA GRIESER   DOB:  [birth year]   MRN:  [id number]  PCP:  Pearline Cables, MD    Chief Complaint: No chief complaint on file.   History of Present Illness:  CODIE HAINER is a 43 y.o. very pleasant female patient who presents with the following:  Patient seen today for a physical and routine labs-however looks like she establish care with an [facility] practice in October.  Will confirm if she wants to continue seeing me back to [facility] Most recent visit with myself was about a year ago after a several year absence  History of obesity, hyperlipidemia, vitamin D deficiency Husband is s/p vasectomy 1 year old daughter, 10 year old son She did have gastric sleeve surgery in 2021  I prescribed Zepbound for her last year to assist with further weight loss  Mammogram about 1 year ago Pap smear ?? Flu vaccine Overdue for tetanus Can start colon cancer screening next year  She has some lab work on file from July, iron profile and CMP, CBC  She has seen hematology previously for iron deficiency, it looks like her most recent visit with hematology was in August-at that point they plan for IV iron treatments Patient Active Problem List   Diagnosis Date Noted   Iron deficiency anemia 10/13/2022   Cervical radiculopathy 11/29/2020   Morbid obesity (HCC) 04/10/2020   Plantar fasciitis 07/09/2017   BMI 45.0-49.9, adult (HCC) 06/24/2012   Anxiety and depression 06/24/2012   History of nephrolithiasis 06/24/2012   Hyperlipidemia 06/24/2012   Vitamin D deficiency 05/07/2011    Past Medical History:  Diagnosis Date   Anxiety    Depression    GERD (gastroesophageal reflux disease)    History of hiatal hernia    History of kidney stones    Kidney stones    PONV (postoperative nausea and vomiting)     Past Surgical  History:  Procedure Laterality Date   BREAST SURGERY     reduction   CESAREAN SECTION  08/2007   x2   CHOLECYSTECTOMY     EYE SURGERY     as a child   LAPAROSCOPIC GASTRIC SLEEVE RESECTION N/A 04/10/2020   Procedure: LAPAROSCOPIC GASTRIC SLEEVE RESECTION;  Surgeon: Berna Bue, MD;  Location[facility];  Service: General;  Laterality: N/A;   UPPER GI ENDOSCOPY N/A 04/10/2020   Procedure: UPPER GI ENDOSCOPY;  Surgeon: Berna Bue, MD;  Location: [facility];  Service: General;  Laterality: N/A;    Social History   Tobacco Use   Smoking status: Former    Current packs/day: 0.00    Average packs/day: 0.3 packs/day for 5.0 years (1.3 ttl pk-yrs)    Types: Cigarettes    Start date: 2000    Quit date: 2005    Years since quitting: 20.2   Smokeless tobacco: Never  Vaping Use   Vaping status: Never Used  Substance Use Topics   Alcohol use: Not Currently   Drug use: No    Family History  Problem Relation Age of Onset   Diabetes Father    Kidney disease Father    Heart disease Father    Diabetes Paternal Grandmother    Cervical cancer Sister     No Known Allergies  Medication list has been reviewed and updated.  Current Outpatient Medications on File Prior to Visit  Medication Sig Dispense Refill   CRANBERRY PO Take 250 mg by mouth daily.     docusate sodium (COLACE) 100 MG capsule Take 1 capsule (100 mg total) by mouth 2 (two) times daily. 10 capsule 0   FLUoxetine (PROZAC) 20 MG capsule Take 20 mg by mouth daily.     LINZESS 145 MCG CAPS capsule Take 145 mcg by mouth daily.     melatonin 5 MG TABS Take 5 mg by mouth at bedtime as needed.     ondansetron (ZOFRAN) 8 MG tablet Take 1 tablet (8 mg total) by mouth every 8 (eight) hours as needed for nausea or vomiting. 30 tablet 0   pantoprazole (PROTONIX) 40 MG tablet Take 1 tablet (40 mg total) by mouth daily. 90 tablet 0   phentermine 15 MG capsule Take 1 capsule (15 mg total) by mouth every morning. 30 capsule 2    triamcinolone cream (KENALOG) 0.1 % Apply 1 Application topically 2 (two) times daily.     No current facility-administered medications on file prior to visit.    Review of Systems:  As per HPI- otherwise negative.   Physical Examination: There were no vitals filed for this visit. There were no vitals filed for this visit. There is no height or weight on file to calculate BMI. Ideal Body Weight:    GEN: no acute distress. HEENT: Atraumatic, Normocephalic.  Ears and Nose: No external deformity. CV: RRR, No M/G/R. No JVD. No thrill. No extra heart sounds. PULM: CTA B, no wheezes, crackles, rhonchi. No retractions. No resp. distress. No accessory muscle use. ABD: S, NT, ND, +BS. No rebound. No HSM. EXTR: No c/c/e PSYCH: Normally interactive. Conversant.    Assessment and Plan: *** Physical exam today.  Encouraged healthy diet and exercise routine Will plan further follow- up pending labs.  Signed Abbe Amsterdam, MD

## 2023-05-28 ENCOUNTER — Encounter: Payer: Self-pay | Admitting: Family Medicine

## 2023-05-28 ENCOUNTER — Ambulatory Visit (INDEPENDENT_AMBULATORY_CARE_PROVIDER_SITE_OTHER): Admitting: Family Medicine

## 2023-05-28 VITALS — BP 122/60 | HR 71 | Temp 98.1°F | Resp 18 | Ht 65.0 in | Wt 240.6 lb

## 2023-05-28 DIAGNOSIS — E785 Hyperlipidemia, unspecified: Secondary | ICD-10-CM | POA: Diagnosis not present

## 2023-05-28 DIAGNOSIS — Z131 Encounter for screening for diabetes mellitus: Secondary | ICD-10-CM

## 2023-05-28 DIAGNOSIS — Z1329 Encounter for screening for other suspected endocrine disorder: Secondary | ICD-10-CM

## 2023-05-28 DIAGNOSIS — Z0000 Encounter for general adult medical examination without abnormal findings: Secondary | ICD-10-CM

## 2023-05-28 DIAGNOSIS — R790 Abnormal level of blood mineral: Secondary | ICD-10-CM

## 2023-05-28 LAB — COMPREHENSIVE METABOLIC PANEL
ALT: 8 U/L (ref 0–35)
AST: 10 U/L (ref 0–37)
Albumin: 4.3 g/dL (ref 3.5–5.2)
Alkaline Phosphatase: 45 U/L (ref 39–117)
BUN: 11 mg/dL (ref 6–23)
CO2: 27 meq/L (ref 19–32)
Calcium: 9.6 mg/dL (ref 8.4–10.5)
Chloride: 102 meq/L (ref 96–112)
Creatinine, Ser: 0.76 mg/dL (ref 0.40–1.20)
GFR: 95.5 mL/min (ref >60.00)
Glucose, Bld: 88 mg/dL (ref 70–99)
Potassium: 4.4 meq/L (ref 3.5–5.1)
Sodium: 136 meq/L (ref 135–145)
Total Bilirubin: 0.4 mg/dL (ref 0.2–1.2)
Total Protein: 6.8 g/dL (ref 6.0–8.3)

## 2023-05-28 LAB — LIPID PANEL
Cholesterol: 251 mg/dL — ABNORMAL HIGH (ref 0–200)
HDL: 51.8 mg/dL (ref >39.00)
LDL Cholesterol: 162 mg/dL — ABNORMAL HIGH (ref 0–99)
NonHDL: 198.73
Total CHOL/HDL Ratio: 5
Triglycerides: 183 mg/dL — ABNORMAL HIGH (ref 0.0–149.0)
VLDL: 36.6 mg/dL (ref 0.0–40.0)

## 2023-05-28 LAB — CBC
HCT: 39.6 % (ref 36.0–46.0)
Hemoglobin: 13.2 g/dL (ref 12.0–15.0)
MCHC: 33.2 g/dL (ref 30.0–36.0)
MCV: 84.6 fl (ref 78.0–100.0)
Platelets: 304 10*3/uL (ref 150.0–400.0)
RBC: 4.69 Mil/uL (ref 3.87–5.11)
RDW: 13 % (ref 11.5–15.5)
WBC: 8.9 10*3/uL (ref 4.0–10.5)

## 2023-05-28 LAB — HEMOGLOBIN A1C: Hgb A1c MFr Bld: 5.3 % (ref 4.6–6.5)

## 2023-05-28 LAB — FERRITIN: Ferritin: 14.6 ng/mL (ref 10.0–291.0)

## 2023-05-28 LAB — TSH: TSH: 1.61 u[IU]/mL (ref 0.35–5.50)

## 2023-06-02 ENCOUNTER — Encounter: Payer: Self-pay | Admitting: Family Medicine

## 2023-06-02 DIAGNOSIS — E559 Vitamin D deficiency, unspecified: Secondary | ICD-10-CM

## 2023-06-02 LAB — HGB FRACTIONATION CASCADE
Hgb A2: 2.3 % (ref 1.8–3.2)
Hgb A: 97.7 % (ref 96.4–98.8)
Hgb F: 0 % (ref 0.0–2.0)
Hgb S: 0 %

## 2023-07-13 ENCOUNTER — Encounter: Payer: Self-pay | Admitting: Family Medicine

## 2023-07-13 MED ORDER — PANTOPRAZOLE SODIUM 20 MG PO TBEC: 20.0000 mg | DELAYED_RELEASE_TABLET | Freq: Every day | ORAL | 1 refills | Status: DC

## 2023-11-10 ENCOUNTER — Encounter (HOSPITAL_COMMUNITY): Payer: Self-pay | Admitting: *Deleted

## 2023-12-07 ENCOUNTER — Encounter: Payer: Self-pay | Admitting: Family Medicine

## 2023-12-10 MED ORDER — METFORMIN HCL 500 MG PO TABS: 500.0000 mg | ORAL_TABLET | Freq: Every day | ORAL | 3 refills | Status: AC

## 2023-12-10 NOTE — Addendum Note (Signed)
 Addended by: WATT RAISIN C on: 12/10/2023 04:41 PM   Modules accepted: Orders

## 2024-02-18 ENCOUNTER — Other Ambulatory Visit: Payer: Self-pay | Admitting: Family Medicine
